# Patient Record
Sex: Male | Born: 1960 | Race: Black or African American | Hispanic: No | Marital: Single | Smoking: Current every day smoker
Health system: Southern US, Community
[De-identification: ages and names within clinical notes are randomized; demographics above are authoritative.]

## PROBLEM LIST (undated history)

## (undated) DIAGNOSIS — J189 Pneumonia, unspecified organism: Secondary | ICD-10-CM

## (undated) DIAGNOSIS — F419 Anxiety disorder, unspecified: Secondary | ICD-10-CM

## (undated) DIAGNOSIS — F209 Schizophrenia, unspecified: Secondary | ICD-10-CM

## (undated) DIAGNOSIS — F32A Depression, unspecified: Secondary | ICD-10-CM

## (undated) DIAGNOSIS — F329 Major depressive disorder, single episode, unspecified: Secondary | ICD-10-CM

## (undated) HISTORY — PX: ABDOMINAL SURGERY: SHX537

---

## 2012-03-06 ENCOUNTER — Emergency Department (HOSPITAL_COMMUNITY): Payer: 59

## 2012-03-06 ENCOUNTER — Encounter (HOSPITAL_COMMUNITY): Payer: Self-pay

## 2012-03-06 ENCOUNTER — Emergency Department (HOSPITAL_COMMUNITY)
Admission: EM | Admit: 2012-03-06 | Discharge: 2012-03-09 | Disposition: A | Discharge status: Other Acute Inpatient Hospital | Payer: 59 | Source: Home / Self Care | Attending: Emergency Medicine | Admitting: Emergency Medicine

## 2012-03-06 DIAGNOSIS — F1010 Alcohol abuse, uncomplicated: Secondary | ICD-10-CM | POA: Insufficient documentation

## 2012-03-06 DIAGNOSIS — F1910 Other psychoactive substance abuse, uncomplicated: Secondary | ICD-10-CM | POA: Insufficient documentation

## 2012-03-06 DIAGNOSIS — F29 Unspecified psychosis not due to a substance or known physiological condition: Secondary | ICD-10-CM

## 2012-03-06 DIAGNOSIS — R45851 Suicidal ideations: Secondary | ICD-10-CM | POA: Insufficient documentation

## 2012-03-06 DIAGNOSIS — Z9114 Patient's other noncompliance with medication regimen: Secondary | ICD-10-CM

## 2012-03-06 DIAGNOSIS — Z9119 Patient's noncompliance with other medical treatment and regimen: Secondary | ICD-10-CM | POA: Insufficient documentation

## 2012-03-06 DIAGNOSIS — R05 Cough: Secondary | ICD-10-CM | POA: Insufficient documentation

## 2012-03-06 DIAGNOSIS — F17200 Nicotine dependence, unspecified, uncomplicated: Secondary | ICD-10-CM | POA: Insufficient documentation

## 2012-03-06 DIAGNOSIS — R209 Unspecified disturbances of skin sensation: Secondary | ICD-10-CM | POA: Insufficient documentation

## 2012-03-06 DIAGNOSIS — R059 Cough, unspecified: Secondary | ICD-10-CM | POA: Insufficient documentation

## 2012-03-06 DIAGNOSIS — Z91199 Patient's noncompliance with other medical treatment and regimen due to unspecified reason: Secondary | ICD-10-CM | POA: Insufficient documentation

## 2012-03-06 DIAGNOSIS — R0602 Shortness of breath: Secondary | ICD-10-CM | POA: Insufficient documentation

## 2012-03-06 DIAGNOSIS — Z8659 Personal history of other mental and behavioral disorders: Secondary | ICD-10-CM | POA: Insufficient documentation

## 2012-03-06 HISTORY — DX: Anxiety disorder, unspecified: F41.9

## 2012-03-06 HISTORY — DX: Schizophrenia, unspecified: F20.9

## 2012-03-06 LAB — CBC WITH DIFFERENTIAL/PLATELET
Eosinophils Absolute: 0 10*3/uL (ref 0.0–0.7)
Lymphocytes Relative: 12 % (ref 12–46)
Lymphs Abs: 1 10*3/uL (ref 0.7–4.0)
Neutrophils Relative %: 73 % (ref 43–77)
Platelets: 198 10*3/uL (ref 150–400)
RBC: 5 MIL/uL (ref 4.22–5.81)
WBC: 8 10*3/uL (ref 4.0–10.5)

## 2012-03-06 LAB — RAPID URINE DRUG SCREEN, HOSP PERFORMED
Amphetamines: NOT DETECTED
Barbiturates: NOT DETECTED
Benzodiazepines: NOT DETECTED
Tetrahydrocannabinol: NOT DETECTED

## 2012-03-06 LAB — COMPREHENSIVE METABOLIC PANEL
ALT: 33 U/L (ref 0–53)
Alkaline Phosphatase: 80 U/L (ref 39–117)
GFR calc Af Amer: >90 mL/min (ref >90)
Glucose, Bld: 85 mg/dL (ref 70–99)
Potassium: 3.5 mEq/L (ref 3.5–5.1)
Sodium: 136 mEq/L (ref 135–145)
Total Protein: 7.6 g/dL (ref 6.0–8.3)

## 2012-03-06 LAB — POCT I-STAT TROPONIN I: Troponin i, poc: 0.01 ng/mL (ref 0.00–0.08)

## 2012-03-06 MED ORDER — ALBUTEROL SULFATE (5 MG/ML) 0.5% IN NEBU
5.0000 mg | INHALATION_SOLUTION | RESPIRATORY_TRACT | Status: DC | PRN
  Administered 2012-03-06: 5 mg via RESPIRATORY_TRACT
  Filled 2012-03-06: qty 1

## 2012-03-06 MED ORDER — IPRATROPIUM BROMIDE 0.02 % IN SOLN
0.5000 mg | Freq: Once | RESPIRATORY_TRACT | Status: AC
  Administered 2012-03-06: 0.5 mg via RESPIRATORY_TRACT
  Filled 2012-03-06: qty 2.5

## 2012-03-06 MED ORDER — ALBUTEROL SULFATE (5 MG/ML) 0.5% IN NEBU
5.0000 mg | INHALATION_SOLUTION | Freq: Once | RESPIRATORY_TRACT | Status: AC
  Administered 2012-03-06: 5 mg via RESPIRATORY_TRACT
  Filled 2012-03-06: qty 1
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
## 2012-03-06 NOTE — ED Notes (Signed)
ACT team at bedside.  

## 2012-03-06 NOTE — ED Notes (Signed)
Patient transported to X-ray 

## 2012-03-06 NOTE — ED Notes (Signed)
PATIENT MADE AWARE THAT HE WILL BE HAVING A TELEPSYCH. PT AGREEABLE. PATIENT ALSO WANTS TO KNOW WHY HE HASNT GOT ANY MORE MEDICATIONS ORDERED. THAT HE THINKS HE NEEDS AN INHALER. THAT HE CANNOT UNDERSTAND WHY MORE ISNT BEING DONE FOR HIM BECAUSE HE IS ACHEY. HE STATES HE HAS A HISTORY OF ASTHMA AND IS A SMOKER

## 2012-03-06 NOTE — ED Notes (Signed)
Pt. Finished with telepsych. Updated on plan of care.

## 2012-03-06 NOTE — ED Notes (Signed)
Pt. Informed to need to urinate. States can't at this time. Gave patient a bottle of water. Informed pt that if he can't urinate we will need to do a urinary catheter.

## 2012-03-06 NOTE — ED Notes (Signed)
Pt. C/o difficulty breathing. States "Ever since I got this cold my asthma is acting up and I keep coughing up stuff". Dr. Radford Pax notified.

## 2012-03-06 NOTE — ED Notes (Signed)
Given urinal. Pt states he will try to urinate.

## 2012-03-06 NOTE — ED Notes (Addendum)
Pt states he started having mid cp about a week ago and also c/o productive cough. Now states he has been coughing up blood instead of mucus greenish yellow. Also reports SOB and numbness in left arm. States he was sent here because he has been out of his psych meds for a while. Has been drinking more eating less and doing cocaine over the last 5 days. His plan is just to drink.

## 2012-03-07 MED ORDER — OLANZAPINE 5 MG PO TABS
5.0000 mg | ORAL_TABLET | Freq: Two times a day (BID) | ORAL | Status: DC
  Administered 2012-03-07 – 2012-03-09 (×5): 5 mg via ORAL
  Filled 2012-03-07 (×8): qty 1

## 2012-03-07 MED ORDER — THIAMINE HCL 100 MG/ML IJ SOLN: 100.0000 mg | Freq: Every day | INTRAMUSCULAR | Status: DC

## 2012-03-07 MED ORDER — LORAZEPAM 1 MG PO TABS
1.0000 mg | ORAL_TABLET | Freq: Four times a day (QID) | ORAL | Status: DC | PRN
  Administered 2012-03-07 – 2012-03-09 (×4): 1 mg via ORAL
  Filled 2012-03-07 (×4): qty 1

## 2012-03-07 MED ORDER — FOLIC ACID 1 MG PO TABS
1.0000 mg | ORAL_TABLET | Freq: Every day | ORAL | Status: DC
  Administered 2012-03-07 – 2012-03-09 (×3): 1 mg via ORAL
  Filled 2012-03-07 (×3): qty 1

## 2012-03-07 MED ORDER — LORAZEPAM 2 MG/ML IJ SOLN: 1.0000 mg | Freq: Four times a day (QID) | INTRAMUSCULAR | Status: DC | PRN

## 2012-03-07 MED ORDER — VITAMIN B-1 100 MG PO TABS
100.0000 mg | ORAL_TABLET | Freq: Every day | ORAL | Status: DC
  Administered 2012-03-07 – 2012-03-09 (×3): 100 mg via ORAL
  Filled 2012-03-07 (×3): qty 1

## 2012-03-07 MED ORDER — ADULT MULTIVITAMIN W/MINERALS CH
1.0000 | ORAL_TABLET | Freq: Every day | ORAL | Status: DC
  Administered 2012-03-07 – 2012-03-09 (×3): 1 via ORAL
  Filled 2012-03-07 (×4): qty 1
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
## 2012-03-07 NOTE — ED Notes (Signed)
Signed up as 2ndary RN to Marylouise Stacks, RN

## 2012-03-07 NOTE — ED Notes (Signed)
Report from [place], Charity fundraiser,

## 2012-03-07 NOTE — BHH Counselor (Signed)
Patient has been accepted at [facility] pending bed availability.

## 2012-03-07 NOTE — ED Notes (Signed)
Pt. Up to shower, changed scrubs, bed made.

## 2012-03-07 NOTE — Progress Notes (Signed)
12:28 AM Pt had telepsychiatry consult by Dr. Henderson Cloud.  He advised that pt needed psychiatric hospitalization for psychosis and suicidal ideation.  He recommended Zyprexa 5 mg bid, and the CIWA protocol with Ativan for treatment of possible alcohol withdrawal.

## 2012-03-07 NOTE — ED Notes (Signed)
Pt. Requested ginger ale and graham crackers, given.
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
## 2012-03-08 NOTE — ED Notes (Signed)
Gave Sitter a lunch break 

## 2012-03-08 NOTE — ED Notes (Signed)
Sitter relieved to go to the bathroom 

## 2012-03-08 NOTE — ED Notes (Signed)
Patient given bag lunch and ginger ale ?

## 2012-03-08 NOTE — BHH Counselor (Signed)
Per Zachary Hull, there are still no beds available on the on the adult unit. Zachary Hull, LCAS

## 2012-03-08 NOTE — ED Provider Notes (Signed)
0830.  Pt is awake, in bed, watching TV.  NAD.  Note stable VS.  No acute overnight events reported.  He has been accepted at [facility] and is awaiting a bed.  Will continue to follow closely.  Tobin Chad, MD 03/08/12 [phone]

## 2012-03-09 ENCOUNTER — Inpatient Hospital Stay (HOSPITAL_COMMUNITY)
Admission: AD | Admit: 2012-03-09 | Discharge: 2012-03-12 | DRG: 885 | Disposition: A | Discharge status: Home / Self Care | Payer: 59 | Source: Ambulatory Visit | Attending: Psychiatry | Admitting: Psychiatry

## 2012-03-09 ENCOUNTER — Encounter (HOSPITAL_COMMUNITY): Payer: Self-pay | Admitting: *Deleted

## 2012-03-09 DIAGNOSIS — Z79899 Other long term (current) drug therapy: Secondary | ICD-10-CM

## 2012-03-09 DIAGNOSIS — F29 Unspecified psychosis not due to a substance or known physiological condition: Secondary | ICD-10-CM

## 2012-03-09 DIAGNOSIS — F1410 Cocaine abuse, uncomplicated: Secondary | ICD-10-CM | POA: Diagnosis present

## 2012-03-09 DIAGNOSIS — F3130 Bipolar disorder, current episode depressed, mild or moderate severity, unspecified: Principal | ICD-10-CM | POA: Diagnosis present

## 2012-03-09 DIAGNOSIS — F1010 Alcohol abuse, uncomplicated: Secondary | ICD-10-CM | POA: Diagnosis present

## 2012-03-09 HISTORY — DX: Depression, unspecified: F32.A

## 2012-03-09 HISTORY — DX: Major depressive disorder, single episode, unspecified: F32.9

## 2012-03-09 MED ORDER — TRAZODONE HCL 50 MG PO TABS
50.0000 mg | ORAL_TABLET | Freq: Every evening | ORAL | Status: DC | PRN
  Administered 2012-03-09: 50 mg via ORAL
  Filled 2012-03-09 (×5): qty 1

## 2012-03-09 MED ORDER — ACETAMINOPHEN 325 MG PO TABS: 650.0000 mg | ORAL_TABLET | Freq: Four times a day (QID) | ORAL | Status: DC | PRN

## 2012-03-09 MED ORDER — NICOTINE 21 MG/24HR TD PT24
21.0000 mg | MEDICATED_PATCH | Freq: Every day | TRANSDERMAL | Status: DC
  Administered 2012-03-10 – 2012-03-12 (×3): 21 mg via TRANSDERMAL
  Filled 2012-03-09 (×6): qty 1

## 2012-03-09 MED ORDER — HYDROXYZINE HCL 25 MG PO TABS
25.0000 mg | ORAL_TABLET | Freq: Four times a day (QID) | ORAL | Status: DC | PRN
  Administered 2012-03-09: 25 mg via ORAL
  Filled 2012-03-09: qty 1

## 2012-03-09 MED ORDER — MAGNESIUM HYDROXIDE 400 MG/5ML PO SUSP: 30.0000 mL | Freq: Every day | ORAL | Status: DC | PRN

## 2012-03-09 MED ORDER — ALUM & MAG HYDROXIDE-SIMETH 200-200-20 MG/5ML PO SUSP: 30.0000 mL | ORAL | Status: DC | PRN

## 2012-03-09 NOTE — ED Notes (Signed)
Report called to Nurse at [facility]

## 2012-03-09 NOTE — Tx Team (Signed)
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  The patient and family have been given the opportunity to ask questions and make suggestions.  [place] Mon 03/09/2012, 2:42 PM

## 2012-03-09 NOTE — ED Notes (Signed)
Clean scrubs given-

## 2012-03-09 NOTE — Progress Notes (Signed)
Adult Psychoeducational Group Note  Date:  03/09/2012 Time:  9:34 PM  Group Topic/Focus:  Wrap-Up Group:   The focus of this group is to help patients review their daily goal of treatment and discuss progress on daily workbooks.  Participation Level:  Active  Participation Quality:  Appropriate  Affect:  Appropriate  Cognitive:  Appropriate  Insight: Appropriate  Engagement in Group:  Engaged  Modes of Intervention:  Support  Additional Comments:  Patient attended and participated in group tonight. He reports that he was at [facility] today and recently came over to [facility]. He slept most of the day while he was at [facility]. This writer explained to him what his schedule would look like tomorrow and instructed him where he could find the schedule.  Lita Mains [facility] 03/09/2012, 9:34 PM

## 2012-03-09 NOTE — Clinical Social Work Note (Signed)
  Type of Therapy: Process Group Therapy  Participation Level:  Did Not Attend    Summary of Progress/Problems: Today's group addressed the issue of overcoming obstacles.  Patients were asked to identify their biggest obstacle post d/c that stands in the way of their on-going success, and then problem solve as to how to manage this.       Zachary Hull 03/09/2012   2:43 PM

## 2012-03-09 NOTE — Progress Notes (Signed)
Patient ID: Zachary Hull, male   DOB: 1960/05/02, 52 y.o.   MRN: [id number] Patient admitted from [facility] for severe alcohol dependency, depression and SI.  Patient was brought into [facility] Friday night due to a 14-day alcohol binge where he was drinking "a fifth and a 1/2 gallon daily."  He reported some cocaine use during that time also.  Patient is depressed over his divorce last month; he was married in November and the marriage was annulled in January.  Patient states that "I've lost everything."  Patient has been staying with a friend; is homeless and unemployed.  He does receive SSI monthly.  He has multiple psyche admissions in ; none of which have been here.  He reports that he feel suicidal with no specific plan.  He does, however, contract for safety.  He also reported hearing voices when he was first brought in, however, denies those now.  He also denies any HI at this time.  Patient reports that he has no family support in the area and will not be returning to his living situation.  He hopes to go to a halfway house or treatment center upon discharge.  His ETOH level was 66 upon admission to [facility] and he was positive for cocaine.

## 2012-03-09 NOTE — ED Provider Notes (Addendum)
Zachary Hull is a 52 y.o. male here with suicidal thoughts, aggressive behavior. Calm this AM, says that he has some shakes during the day. Given hx of alcohol abuse, I placed him on CIWA protocol. Otherwise, medically stable. Pending placement at [facility].    Richardean Canal, MD 03/09/12 0758  10:32 AM Patient accepted at [facility] under Dr. Jannifer Franklin. Stable for transfer.   Richardean Canal, MD 03/09/12 1032
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
## 2012-03-10 ENCOUNTER — Encounter (HOSPITAL_COMMUNITY): Payer: Self-pay | Admitting: Psychiatry

## 2012-03-10 DIAGNOSIS — F3130 Bipolar disorder, current episode depressed, mild or moderate severity, unspecified: Principal | ICD-10-CM

## 2012-03-10 DIAGNOSIS — F1410 Cocaine abuse, uncomplicated: Secondary | ICD-10-CM

## 2012-03-10 DIAGNOSIS — F1010 Alcohol abuse, uncomplicated: Secondary | ICD-10-CM

## 2012-03-10 DIAGNOSIS — F29 Unspecified psychosis not due to a substance or known physiological condition: Secondary | ICD-10-CM

## 2012-03-10 MED ORDER — HALOPERIDOL 5 MG PO TABS
5.0000 mg | ORAL_TABLET | Freq: Two times a day (BID) | ORAL | Status: DC
  Administered 2012-03-10 – 2012-03-12 (×4): 5 mg via ORAL
  Filled 2012-03-10 (×8): qty 1

## 2012-03-10 MED ORDER — TRAZODONE HCL 50 MG PO TABS
150.0000 mg | ORAL_TABLET | Freq: Every day | ORAL | Status: DC
  Administered 2012-03-11: 150 mg via ORAL
  Filled 2012-03-10 (×2): qty 3

## 2012-03-10 MED ORDER — BENZTROPINE MESYLATE 1 MG PO TABS
1.0000 mg | ORAL_TABLET | Freq: Two times a day (BID) | ORAL | Status: DC
  Administered 2012-03-10 – 2012-03-12 (×4): 1 mg via ORAL
  Filled 2012-03-10 (×8): qty 1

## 2012-03-10 MED ORDER — FLUOXETINE HCL 10 MG PO CAPS
10.0000 mg | ORAL_CAPSULE | Freq: Every day | ORAL | Status: DC
  Administered 2012-03-10 – 2012-03-12 (×3): 10 mg via ORAL
  Filled 2012-03-10 (×5): qty 1

## 2012-03-10 NOTE — Clinical Social Work Note (Signed)
[facility] LCSW Group Therapy  03/10/2012 , 2:16 PM   Type of Therapy:  Group Therapy  Participation Level:  Did not attend   Summary of Progress/Problems: Today's group focused on the term Diagnosis.  Participants were asked to define the term, and then pronounce whether it is a negative, positive or neutral term.  Daryel Gerald B 03/10/2012 , 2:16 PM

## 2012-03-10 NOTE — Progress Notes (Addendum)
D: Patient at the [facility] window.  Patient states he has been irritated today.  Patient states he has not been sleeping well.  Patient rates depression and anxiety both 7/10 tonight.  Patient states he has been having passive SI denies HI and states he is having auditory hallucinations hearing his mothers voice.  Patient verbally contracts for safety. A: Staff to monitor Q 15 mins for safety.  Encouragement and support offered.  Scheduled medications administered per orders.  R: Patient remains safe on the unit.  Patient did not attend group tonight.  Patient taking administered medications.

## 2012-03-10 NOTE — [facility] Suicide Risk Assessment (Signed)
Suicide Risk Assessment  Admission Assessment     Nursing information obtained from:    Demographic factors:    Current Mental Status:    Loss Factors:    Historical Factors:    Risk Reduction Factors:     CLINICAL FACTORS:   Severe Anxiety and/or Agitation Bipolar Disorder:   Depressive phase Depression:   Comorbid alcohol abuse/dependence Delusional Hopelessness Impulsivity Insomnia Alcohol/Substance Abuse/Dependencies Currently Psychotic  COGNITIVE FEATURES THAT CONTRIBUTE TO RISK:  Closed-mindedness Polarized thinking    SUICIDE RISK:   Minimal: No identifiable suicidal ideation.  Patients presenting with no risk factors but with morbid ruminations; may be classified as minimal risk based on the severity of the depressive symptoms  PLAN OF CARE:1. Admit for crisis management and stabilization. 2. Medication management to reduce current symptoms to base line and improve the  patient's overall level of functioning 3. Treat health problems as indicated. 4. Develop treatment plan to decrease risk of relapse upon discharge and the need for readmission. 5. Psycho-social education regarding relapse prevention and self care. 6. Health care follow up as needed for medical problems. 7. Restart home medications where appropriate.   I certify that inpatient services furnished can reasonably be expected to improve the patient's condition.  Khamarion Bjelland,MD 03/10/2012, 10:27 AM

## 2012-03-10 NOTE — Progress Notes (Signed)
Patient ID: Zachary Hull, male   DOB: [birth year], 52 y.o.   MRN: [id number] Patient restless and anxious today.  He was concerned about his medications today.  He was started on prozac and haldol today.  He is also concerned that he will be unable to sleep tonight.  He met with MD and expressed his concerns over his medications and was receptive to the information he received.  Patient has been in multiple psyche facilities in the [place] area and has not been consistant with medication regimen.  He has been on a 14 day ETOH binge.  He denies any SI/HI/AVH today.    Continue to monitor medication management and MD orders.  Safety checks continues every 15 minutes per protocol.  Patient has been attending groups and participating.
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
## 2012-03-10 NOTE — Progress Notes (Signed)
[facility] LCSW Aftercare Discharge Planning Group Note  03/10/2012 11:01 AM  Participation Quality:  Did not attend    Zachary Hull 03/10/2012, 11:01 AM

## 2012-03-10 NOTE — Treatment Plan (Signed)
Interdisciplinary Treatment Plan Update (Adult)  Date: 03/10/2012  Time Reviewed: 7:59 AM   Progress in Treatment: Attending groups: Intermittently Participating in groups: Yes,  When there Taking medication as prescribed: Yes Tolerating medication: Yes   Family/Significant other contact made:  No Patient understands diagnosis:  Yes As evidenced by requesting help with mood stabilization and hallucinations Discussing patient identified problems/goals with staff:  Yes  See below Medical problems stabilized or resolved:  Yes Denies suicidal/homicidal ideation: Yes Issues/concerns per patient self-inventory:  Not filled out Other:  New problem(s) identified: N/A  Reason for Continuation of Hospitalization: Depression Hallucinations Medication stabilization Suicidal ideation  Interventions implemented related to continuation of hospitalization: Haldol, Trazodone, Prozac trial  Encourage group attendance and participation  Additional comments:  Estimated length of stay:  Discharge Plan:  New goal(s): N/A  Review of initial/current patient goals per problem list:   1.  Goal(s): Eliminate SI  Met:  No  Target date:2/21  As evidenced ZO:XWRU report  2.  Goal (s): Stabilize mood with the use of medications/therapeutic milieu  Met:  No  Target date:2/21  As evidenced EA:[id number] presents as grim with flat affect.  He readily admits to feeling depressed to the extent that he has passive SI  3.  Goal(s): Eliminate psychosis with the use of medication/therapeutic milieu  Met:  No  Target date:2/21  As evidenced XB:JYNWGN states that he is currently hearing voices, feels paranoid and [resents with delusional thought  4.  Goal(s): Identify comprehensive mental wellness, sobriety plan  Met:  No  Target date:2/21  As evidenced by: Present plan that he and CSW identify  Attendees: Patient:  Zachary Hull 03/10/2012 7:59AM  Family:     Physician:  Thedore Mins  03/10/2012 7:59 AM   Nursing:  Joslyn Devon  03/10/2012 7:59 AM   Clinical Social Worker:  Richelle Ito 03/10/2012 7:59 AM   Extender:  Verne Spurr PA 03/10/2012 7:59 AM   Other:     Other:     Other:     Other:      Scribe for Treatment Team:   Ida Rogue, 03/10/2012 7:59 AM

## 2012-03-11 DIAGNOSIS — F3130 Bipolar disorder, current episode depressed, mild or moderate severity, unspecified: Principal | ICD-10-CM

## 2012-03-11 MED ORDER — TRAZODONE HCL 100 MG PO TABS
200.0000 mg | ORAL_TABLET | Freq: Every day | ORAL | Status: DC
  Administered 2012-03-11: 200 mg via ORAL
  Filled 2012-03-11 (×3): qty 2

## 2012-03-11 NOTE — Progress Notes (Signed)
[facility] MD Progress Note  03/11/2012 2:22 PM Wendy Mikles  MRN:  [id number] Subjective:  "I'm so-so" met with Maisie Fus and treatment team today. He reports he didn't sleep well although it is documented that he slept 6.25 hours. He states he wants to go to [facility] or 14 day program and then to an [facility]. Diagnosis:  Bipolar disorder most recent episode depressed  ADL's:  Intact  Sleep: Poor  Appetite:  Fair  Suicidal Ideation:  denies Homicidal Ideation:  denies AEB (as evidenced by):the patient's verbal report, affect, and behavior.   Psychiatric Specialty Exam: Review of Systems  Constitutional: Negative.  Negative for fever, chills, weight loss, malaise/fatigue and diaphoresis.  HENT: Negative for congestion and sore throat.   Eyes: Negative for blurred vision, double vision and photophobia.  Respiratory: Negative for cough, shortness of breath and wheezing.   Cardiovascular: Negative for chest pain, palpitations and PND.  Gastrointestinal: Negative for heartburn, nausea, vomiting, abdominal pain, diarrhea and constipation.  Musculoskeletal: Negative for myalgias, joint pain and falls.  Neurological: Negative for dizziness, tingling, tremors, sensory change, speech change, focal weakness, seizures, loss of consciousness, weakness and headaches.  Endo/Heme/Allergies: Negative for polydipsia. Does not bruise/bleed easily.  Psychiatric/Behavioral: Negative for depression, suicidal ideas, hallucinations, memory loss and substance abuse. The patient is not nervous/anxious and does not have insomnia.     Blood pressure 114/77, pulse 86, temperature 97.5 F (36.4 C), temperature source Oral, resp. rate 16, height 5\' 5"  (1.651 m), weight 68.947 kg (152 lb).Body mass index is 25.29 kg/(m^2).  General Appearance: Disheveled  Eye Contact::  Good  Speech:  Clear and Coherent  Volume:  Normal  Mood:  Anxious and Depressed  Affect:  Congruent  Thought Process:  Linear  Orientation:  Full  (Time, Place, and Person)  Thought Content:  Negative  Suicidal Thoughts:  No  Homicidal Thoughts:  No  Memory:  Immediate;   Fair  Judgement:  Fair  Insight:  Lacking  Psychomotor Activity:  Negative  Concentration:  Fair  Recall:  Fair  Akathisia:  No  Handed:  Right  AIMS (if indicated):     Assets:  Physical Health  Sleep:  Number of Hours: 6.25   Current Medications: Current Facility-Administered Medications  Medication Dose Route Frequency Provider Last Rate Last Dose  . acetaminophen (TYLENOL) tablet 650 mg  650 mg Oral Q6H PRN Sanjuana Kava, NP      . alum & mag hydroxide-simeth (MAALOX/MYLANTA) 200-200-20 MG/5ML suspension 30 mL  30 mL Oral Q4H PRN Sanjuana Kava, NP      . benztropine (COGENTIN) tablet 1 mg  1 mg Oral BID Mojeed Akintayo   1 mg at 03/11/12 0816  . FLUoxetine (PROZAC) capsule 10 mg  10 mg Oral Daily Mojeed Akintayo   10 mg at 03/11/12 0816  . haloperidol (HALDOL) tablet 5 mg  5 mg Oral BID Mojeed Akintayo   5 mg at 03/11/12 0816  . hydrOXYzine (ATARAX/VISTARIL) tablet 25 mg  25 mg Oral QID PRN Sanjuana Kava, NP   25 mg at 03/09/12 2122  . magnesium hydroxide (MILK OF MAGNESIA) suspension 30 mL  30 mL Oral Daily PRN Sanjuana Kava, NP      . nicotine (NICODERM CQ - dosed in mg/24 hours) patch 21 mg  21 mg Transdermal Q0600 Sanjuana Kava, NP   21 mg at 03/11/12 0630  . traZODone (DESYREL) tablet 200 mg  200 mg Oral QHS Verne Spurr, PA-C  Lab Results: No results found for this or any previous visit (from the past 48 hour(s)).  Physical Findings: AIMS: Facial and Oral Movements Muscles of Facial Expression: None, normal Lips and Perioral Area: None, normal Jaw: None, normal Tongue: None, normal,Extremity Movements Upper (arms, wrists, hands, fingers): None, normal Lower (legs, knees, ankles, toes): None, normal, Trunk Movements Neck, shoulders, hips: None, normal, Overall Severity Severity of abnormal movements (highest score from questions above):  None, normal Incapacitation due to abnormal movements: None, normal Patient's awareness of abnormal movements (rate only patient's report): No Awareness, Dental Status Current problems with teeth and/or dentures?: No Does patient usually wear dentures?: Yes  CIWA:    COWS:     Treatment Plan Summary: Daily contact with patient to assess and evaluate symptoms and progress in treatment Medication management  Plan: 1. Continue crisis management and stabilization. 2. Medication management to reduce current symptoms to base line and improve patient's overall level of functioning 3. Treat health problems as indicated. 4. Develop treatment plan to decrease risk of relapse upon discharge and the need for     readmission. 5. Psycho-social education regarding relapse prevention and self care. 6. Health care follow up as needed for medical problems. 7. Continue home medications where appropriate. 8. Patient will likely discharge out tomorrow if a bed is available for him at [facility].  Medical Decision Making Problem Points:  Established problem, stable/improving (1) Data Points:  Review or order medicine tests (1)  I certify that inpatient services furnished can reasonably be expected to improve the patient's condition.  Rona Ravens.Nachmen Mansel RPAC 03/11/2012, 2:22 PM

## 2012-03-11 NOTE — Progress Notes (Signed)
[facility] LCSW Aftercare Discharge Planning Group Note  03/11/2012 12:38 PM  Participation Quality:  Attentive  Affect:  Flat  Cognitive:  Appropriate  Insight:  Limited  Engagement in Group:  Engaged  Modes of Intervention:  Discussion, Exploration and Socialization  Summary of Progress/Problems: Yamir revealed his agenda today.  Stated that he met his wife on-line, that the marriage was doomed from the start, and he is stuck in [place] until he gets his disability check, which will then open options to him, including the possibility of returning to [place] to stay with an uncle.  He expressed interest in [facility] and [address], but after group was observed carrying information about a house to rent locally.  Revealed he has been in rehab at [facility], [facility] and a place in [place].  Ida Rogue 03/11/2012, 12:38 PM

## 2012-03-11 NOTE — Progress Notes (Signed)
D: Patient denies HI and visual hallucinations and admits to on and off thoughts of SI and auditory hallucinations; patient reports sleep is poor; reports appetite to be improving ; reports energy level is low ; reports ability to pay attention is improving; rates depression as 7/10; rates hopelessness 2/10; rates anxiety as 0/10;   A: Monitored q 15 minutes; patient encouraged to attend groups; patient educated about medications; patient given medications per physician orders; patient encouraged to express feelings and/or concerns  R: Patient reports hearing his mother's voice and also voices telling him that he is not needed ; patient's interaction with staff and peers is appropriate and patient can very bright in affect at times; patient was able to set goal to talk with staff 1:1 when having feelings of SI; patient is taking medications as prescribed and tolerating medications; patient is attending all groups

## 2012-03-11 NOTE — Progress Notes (Signed)
The focus of this group is to help patients review their daily goal of treatment and discuss progress on daily workbooks. Pt attended the evening wrap-up group and participated in the discussion, responding to all prompts from the Writer. Pt reported that he had a good day due in large part to his opening up to staff and peers. "It felt really good to get things off my chest that had been bothering me." Pt stated that he doesn't do this often enough, but realizes the importance of sharing his feelings with others. Pt smiled throughout group and appeared engaged.

## 2012-03-11 NOTE — Clinical Social Work Note (Signed)
[facility] Group Notes:  (Counselor/Nursing/MHT/Case Management/Adjunct)  12/06/2011 12:00 PM  Type of Therapy:  Group Therapy  Participation Level:  Did Not Attend  Smart, Heather N 12/06/2011, 12:00 PM  
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
## 2012-03-11 NOTE — Progress Notes (Signed)
D: Patient in the hallway on approach.  Patient states he had a good day.  Patient states he went outside today and enjoyed the weather.  Patient state she knows what he has to do and that is to stop drinking and to stay on the right track.  Patient states, "I brought this all on myself and I know what I need to do."  Patient states he is still having SI but verbally contracts for safety.  Patient states he is still having auditory hallucinations hearing his mother's voice but states the SI and auditory hallucinations have decreased.   A: Staff to monitor Q 15 mins for safety.  Encouragement and support offered.  Scheduled medications administered per orders.   R: Patient remains safe on the unit.  Patient attended group tonight.  Patient calm cooperative and taking administered medications.

## 2012-03-11 NOTE — Progress Notes (Signed)
Recreation Therapy Notes  03/11/2012         Time: 9:30am      Group Topic/Focus: Leisure Education  Participation Level: Minimal  Participation Quality: Appropriate  Affect: Appropriate  Cognitive: Appropriate   Additional Comments: Patient stayed in group for approximately 5 minutes. Patient returned to group at 10:55. Patient was then asked to leave group by member of nursing staff. Patient returned to group during group wrap up.   Marykay Lex Sadiel Mota, LRT/CTRS   Jearl Klinefelter 03/11/2012 12:51 PM

## 2012-03-12 MED ORDER — FLUOXETINE HCL 10 MG PO CAPS: 10.0000 mg | ORAL_CAPSULE | Freq: Every day | ORAL | Status: DC

## 2012-03-12 MED ORDER — HALOPERIDOL 5 MG PO TABS: 5.0000 mg | ORAL_TABLET | Freq: Two times a day (BID) | ORAL | Status: DC

## 2012-03-12 MED ORDER — TRAZODONE HCL 100 MG PO TABS: 200.0000 mg | ORAL_TABLET | Freq: Every day | ORAL | Status: DC

## 2012-03-12 MED ORDER — BENZTROPINE MESYLATE 1 MG PO TABS: 1.0000 mg | ORAL_TABLET | Freq: Two times a day (BID) | ORAL | Status: DC

## 2012-03-12 NOTE — Progress Notes (Signed)
[facility] INPATIENT:  Family/Significant Other Suicide Prevention Education  Suicide Prevention Education: Pt had 2 outstanding warrants and was picked up by police at d/c. Patient Discharged to Other Healthcare Facility:  Suicide Prevention Education Not Provided:   The patient is discharging to another healthcare facility for continuation of treatment.  The patient's medical information, including suicide ideations and risk factors, are a part of the medical information shared with the receiving healthcare facility.  Daryel Gerald B 03/12/2012, 11:25 AM

## 2012-03-12 NOTE — Discharge Summary (Signed)
Physician Discharge Summary Note  Patient:  Zachary Hull is an 52 y.o., male MRN:  [id number] DOB:  10-Nov-1960 Patient phone:  [phone] (home)  Patient address:   [address] [place] [address],   Date of Admission:  03/09/2012 Date of Discharge: 03/12/2012  Reason for Admission: psychosis with suicidal ideation, cocaine and alcohol abuse  Discharge Diagnoses: Principal Problem:   Bipolar I disorder, most recent episode (or current) depressed, unspecified Active Problems:   Psychosis   Cocaine abuse   Alcohol abuse, unspecified Discharge Diagnoses:  AXIS I: Bipolar I disorder, most recent episode (or current) depressed, unspecified  AXIS II: Antisocial Personality Disorder  AXIS III:  Past Medical History   Diagnosis  Date   AXIS IV: problems related to legal system/crime  AXIS V: 61-70 mild symptoms  Review of Systems  Constitutional: Negative.  Negative for fever, chills, weight loss, malaise/fatigue and diaphoresis.  HENT: Negative for congestion and sore throat.   Eyes: Negative for blurred vision, double vision and photophobia.  Respiratory: Negative for cough, shortness of breath and wheezing.   Cardiovascular: Negative for chest pain, palpitations and PND.  Gastrointestinal: Negative for heartburn, nausea, vomiting, abdominal pain, diarrhea and constipation.  Musculoskeletal: Negative for myalgias, joint pain and falls.  Neurological: Negative for dizziness, tingling, tremors, sensory change, speech change, focal weakness, seizures, loss of consciousness, weakness and headaches.  Endo/Heme/Allergies: Negative for polydipsia. Does not bruise/bleed easily.  Psychiatric/Behavioral: Negative for depression, suicidal ideas, hallucinations, memory loss and substance abuse. The patient is not nervous/anxious and does not have insomnia.    Level of Care:  OP  Hospital Course:  Shah presented to the ED reporting suicidal ideation and auditory and visual hallucinations  after abusing cocaine and alcohol and being non compliant with his medications. He was given medical clearance and transferred to [facility] for further stabilization and crisis management.    Upon transfer to [facility] reported that he was suicidal after finding that his new wife he met on the internet had been cheating on him.  He also noted that he had been all over [place] in various psychiatric facilities and was homeless.  Inocente also neglected to report that he was in violation of his current parole status and had an outstanding charge for absconding.  Of note is the patient's long time criminal history available on [facility] website.     He was treated with Haldol 5mg  po BID for his psychosis, and prozac 10mg  for his symptoms of depression.  He was covered with cogentin for EPS, and given Trazodone for insomnia.  He continued to report "poor sleep" although he was documented as sleeping 6.25 hours or more per night and his Trazodone was increased to 200mg  at night.        He was seen by a clinical provider each day to evaluate his response to treatment and his progress. He was asked about further substance abuse treatment upon discharge and wanted to know what we would do after he went into a 14 day program, where would he go, how would he live?      Ashby was offered further rehab treatment for his substance abuse issues, but was declined due to his outstanding charges.  When asked about further care he asked to be discharged so he could return to his home in [place], [place].  He denied suicidal ideation, homicidal ideation, or AVH.  He was in full contact with reality.  Saurabh was asked about his legal history and became angry  when confronted with the facts but did not act out. The police were notified and he was picked up by them upon discharge.   Consults:  None  Significant Diagnostic Studies:  labs: please see all labs associated with this admission via EMR  Discharge Vitals:   Blood pressure 126/84, pulse  84, temperature 97.6 F (36.4 C), temperature source Oral, resp. rate 20, height 5\' 5"  (1.651 m), weight 68.947 kg (152 lb). Body mass index is 25.29 kg/(m^2). Lab Results:   No results found for this or any previous visit (from the past 72 hour(s)).  Physical Findings: AIMS: Facial and Oral Movements Muscles of Facial Expression: None, normal Lips and Perioral Area: None, normal Jaw: None, normal Tongue: None, normal,Extremity Movements Upper (arms, wrists, hands, fingers): None, normal Lower (legs, knees, ankles, toes): None, normal, Trunk Movements Neck, shoulders, hips: None, normal, Overall Severity Severity of abnormal movements (highest score from questions above): None, normal Incapacitation due to abnormal movements: None, normal Patient's awareness of abnormal movements (rate only patient's report): No Awareness, Dental Status Current problems with teeth and/or dentures?: No Does patient usually wear dentures?: Yes  CIWA:    COWS:     Psychiatric Specialty Exam: See Psychiatric Specialty Exam and Suicide Risk Assessment completed by Attending Physician prior to discharge.  Discharge destination:  Other:  police picked up the patient  Is patient on multiple antipsychotic therapies at discharge:  No   Has Patient had three or more failed trials of antipsychotic monotherapy by history:  No  Recommended Plan for Multiple Antipsychotic Therapies: Not applicable   Discharge Orders   Future Orders Complete By Expires     Diet - low sodium heart healthy  As directed     Discharge instructions  As directed     Comments:      Take all your medications as prescribed by your mental healthcare provider. Report any adverse effects and or reactions from your medicines to your outpatient provider promptly. Patient is instructed and cautioned to not engage in alcohol and or illegal drug use while on prescription medicines. In the event of worsening symptoms, patient is instructed  to call the crisis hotline, 911 and or go to the nearest ED for appropriate evaluation and treatment of symptoms. Follow-up with your primary care provider for your other medical issues, concerns and or health care needs.    Increase activity slowly  As directed         Medication List    STOP taking these medications       aspirin-sod bicarb-citric acid 325 MG Tbef  Commonly known as:  ALKA-SELTZER      TAKE these medications     Indication   benztropine 1 MG tablet  Commonly known as:  COGENTIN  Take 1 tablet (1 mg total) by mouth 2 (two) times daily. For EPS.   Indication:  Extrapyramidal Reaction caused by Medications     FLUoxetine 10 MG capsule  Commonly known as:  PROZAC  Take 1 capsule (10 mg total) by mouth daily. For depression.   Indication:  Depression     haloperidol 5 MG tablet  Commonly known as:  HALDOL  Take 1 tablet (5 mg total) by mouth 2 (two) times daily.   Indication:  Psychosis     traZODone 100 MG tablet  Commonly known as:  DESYREL  Take 2 tablets (200 mg total) by mouth at bedtime.   Indication:  Trouble Sleeping  Follow-up Information   Follow up with [facility].      Follow-up recommendations:  As noted above  Comments:   Total Discharge Time:  Greater than 30 minutes.  Signed: Rona Ravens. Avila Albritton RPAC 11:57 AM 03/12/2012

## 2012-03-12 NOTE — Progress Notes (Signed)
Pt discharged per MD orders; pt currently denies SI/HI and auditory/visual hallucinations; pt was given education by RN regarding follow-up appointments and medications and pt denied any questions or concerns about these instructions; pt was then escorted to search room to retrieve his belongings by RN before being discharged to hospital lobby. 

## 2012-03-12 NOTE — Progress Notes (Signed)
[facility] Adult Case Management Discharge Plan :  Will you be returning to the same living situation after discharge: No. At discharge, do you have transportation home?:Yes,  [place] Police will pick him up for outstanding warrants Do you have the ability to pay for your medications:Yes,  MCD  Release of information consent forms completed and in the chart;  Patient's signature needed at discharge.  Patient to Follow up at: Follow-up Information   Follow up with [facility]. (Go to the walk-in clinic M or W from 1-4PM  Enter thru lower parking lot doors)    Contact information:   [address]  [place]  [phone]      Patient denies SI/HI:   Yes,  yes    Safety Planning and Suicide Prevention discussed:  Yes,  yes  Ida Rogue 03/12/2012, 11:27 AM

## 2012-03-12 NOTE — Progress Notes (Signed)
Adult Psychoeducational Group Note  Date:  03/12/2012 [id number] Group Topic/Focus:  Self Esteem Action Plan:   The focus of this group is to help patients create a plan to continue to build self-esteem after discharge.  Participation Level:  Active  Participation Quality:  Appropriate  Affect:  Appropriate  Cognitive:  Oriented  Insight: Appropriate  Engagement in Group:  Engaged  Modes of Intervention:  Education  Additional Comment Pt attended group this morning.  Lilliemae Fruge E 03/12/2012, 11:10 AM

## 2012-03-12 NOTE — [facility] Suicide Risk Assessment (Signed)
Suicide Risk Assessment  Discharge Assessment     Demographic Factors:  Male, Low socioeconomic status and Unemployed  Mental Status Per Nursing Assessment::   On Admission:     Current Mental Status by Physician: patient denies suicidal ideation, intent or plan  Loss Factors: Legal issues and Financial problems/change in socioeconomic status  Historical Factors: Impulsivity  Risk Reduction Factors:   Living with another person, especially a relative  Continued Clinical Symptoms:  Resolving psychosis  Cognitive Features That Contribute To Risk:  Closed-mindedness    Suicide Risk:  Minimal: No identifiable suicidal ideation.  Patients presenting with no risk factors but with morbid ruminations; may be classified as minimal risk based on the severity of the depressive symptoms  Discharge Diagnoses:   AXIS I:  Bipolar I disorder, most recent episode (or current) depressed, unspecified  AXIS II:  Antisocial Personality Disorder AXIS III:   Past Medical History  Diagnosis Date   AXIS IV:  problems related to legal system/crime AXIS V:  61-70 mild symptoms  Plan Of Care/Follow-up recommendations:  Activity:  as tolerated Diet:  healthy Tests:  routine blood test Other:  patient to keep his after care appointment.  Is patient on multiple antipsychotic therapies at discharge:  No   Has Patient had three or more failed trials of antipsychotic monotherapy by history:  No  Recommended Plan for Multiple Antipsychotic Therapies: N/A  Chayah Mckee,MD 03/12/2012, 10:43 AM

## 2012-03-17 NOTE — Progress Notes (Signed)
Patient Discharge Instructions:  After Visit Summary (AVS):   Faxed to:  03/17/12 Discharge Summary Note:   Faxed to:  03/17/12 Psychiatric Admission Assessment Note:   Faxed to:  03/17/12 Suicide Risk Assessment - Discharge Assessment:   Faxed to:  03/17/12 Faxed/Sent to the Next Level Care provider:  03/17/12 Faxed to [facility] @ [phone]  Jerelene Redden, 03/17/2012, 3:32 PM

## 2013-12-10 ENCOUNTER — Ambulatory Visit: Payer: 59 | Admitting: Family Medicine

## 2014-01-17 ENCOUNTER — Emergency Department (HOSPITAL_COMMUNITY)
Admission: EM | Admit: 2014-01-17 | Discharge: 2014-01-17 | Discharge status: Absent without leave | Payer: Medicaid Other | Source: Home / Self Care

## 2014-08-23 ENCOUNTER — Emergency Department (HOSPITAL_COMMUNITY): Payer: Medicaid Other

## 2014-08-23 ENCOUNTER — Emergency Department (HOSPITAL_COMMUNITY)
Admission: EM | Admit: 2014-08-23 | Discharge: 2014-08-23 | Disposition: A | Discharge status: Home / Self Care | Payer: Medicaid Other | Attending: Emergency Medicine | Admitting: Emergency Medicine

## 2014-08-23 ENCOUNTER — Encounter (HOSPITAL_COMMUNITY): Payer: Self-pay | Admitting: Emergency Medicine

## 2014-08-23 DIAGNOSIS — F209 Schizophrenia, unspecified: Secondary | ICD-10-CM | POA: Diagnosis not present

## 2014-08-23 DIAGNOSIS — Y92410 Unspecified street and highway as the place of occurrence of the external cause: Secondary | ICD-10-CM | POA: Insufficient documentation

## 2014-08-23 DIAGNOSIS — F329 Major depressive disorder, single episode, unspecified: Secondary | ICD-10-CM | POA: Diagnosis not present

## 2014-08-23 DIAGNOSIS — Y9389 Activity, other specified: Secondary | ICD-10-CM | POA: Diagnosis not present

## 2014-08-23 DIAGNOSIS — S199XXA Unspecified injury of neck, initial encounter: Secondary | ICD-10-CM | POA: Insufficient documentation

## 2014-08-23 DIAGNOSIS — F419 Anxiety disorder, unspecified: Secondary | ICD-10-CM | POA: Insufficient documentation

## 2014-08-23 DIAGNOSIS — Y998 Other external cause status: Secondary | ICD-10-CM | POA: Insufficient documentation

## 2014-08-23 DIAGNOSIS — Z720 Tobacco use: Secondary | ICD-10-CM | POA: Diagnosis not present

## 2014-08-23 DIAGNOSIS — Z79899 Other long term (current) drug therapy: Secondary | ICD-10-CM | POA: Insufficient documentation

## 2014-08-23 DIAGNOSIS — S8991XA Unspecified injury of right lower leg, initial encounter: Secondary | ICD-10-CM | POA: Diagnosis present

## 2014-08-23 DIAGNOSIS — S8391XA Sprain of unspecified site of right knee, initial encounter: Secondary | ICD-10-CM | POA: Insufficient documentation

## 2014-08-23 DIAGNOSIS — S79911A Unspecified injury of right hip, initial encounter: Secondary | ICD-10-CM | POA: Diagnosis not present

## 2014-08-23 MED ORDER — OXYCODONE-ACETAMINOPHEN 5-325 MG PO TABS
1.0000 | ORAL_TABLET | Freq: Once | ORAL | Status: AC
  Administered 2014-08-23: 1 via ORAL
  Filled 2014-08-23: qty 1

## 2014-08-23 MED ORDER — OXYCODONE-ACETAMINOPHEN 5-325 MG PO TABS
1.0000 | ORAL_TABLET | ORAL | Status: DC | PRN
Start: 2014-08-23 — End: 2014-12-17

## 2014-08-23 NOTE — Discharge Instructions (Signed)
Wear the immobilizer, use crutches as needed.  Knee Sprain A knee sprain is a tear in one of the strong, fibrous tissues that connect the bones (ligaments) in your knee. The severity of the sprain depends on how much of the ligament is torn. The tear can be either partial or complete. CAUSES  Often, sprains are a result of a fall or injury. The force of the impact causes the fibers of your ligament to stretch too much. This excess tension causes the fibers of your ligament to tear. SIGNS AND SYMPTOMS  You may have some loss of motion in your knee. Other symptoms include:  Bruising.  Pain in the knee area.  Tenderness of the knee to the touch.  Swelling. DIAGNOSIS  To diagnose a knee sprain, your health care provider will physically examine your knee. Your health care provider may also suggest an X-ray exam of your knee to make sure no bones are broken. TREATMENT  If your ligament is only partially torn, treatment usually involves keeping the knee in a fixed position (immobilization) or bracing your knee for activities that require movement for several weeks. To do this, your health care provider will apply a bandage, cast, or splint to keep your knee from moving and to support your knee during movement until it heals. For a partially torn ligament, the healing process usually takes 4-6 weeks. If your ligament is completely torn, depending on which ligament it is, you may need surgery to reconnect the ligament to the bone or reconstruct it. After surgery, a cast or splint may be applied and will need to stay on your knee for 4-6 weeks while your ligament heals. HOME CARE INSTRUCTIONS 1. Keep your injured knee elevated to decrease swelling. 2. To ease pain and swelling, apply ice to the injured area: 1. Put ice in a plastic bag. 2. Place a towel between your skin and the bag. 3. Leave the ice on for 20 minutes, 2-3 times a day. 3. Only take medicine for pain as directed by your health care  provider. 4. Do not leave your knee unprotected until pain and stiffness go away (usually 4-6 weeks). 5. If you have a cast or splint, do not allow it to get wet. If you have been instructed not to remove it, cover it with a plastic bag when you shower or bathe. Do not swim. 6. Your health care provider may suggest exercises for you to do during your recovery to prevent or limit permanent weakness and stiffness. SEEK IMMEDIATE MEDICAL CARE IF: 1. Your cast or splint becomes damaged. 2. Your pain becomes worse. 3. You have significant pain, swelling, or numbness below the cast or splint. MAKE SURE YOU: 1. Understand these instructions. 2. Will watch your condition. 3. Will get help right away if you are not doing well or get worse. Document Released: 01/07/2005 Document Revised: 10/28/2012 Document Reviewed: 08/19/2012 [facility] Patient Information 2015 [place], [place]. This information is not intended to replace advice given to you by your health care provider. Make sure you discuss any questions you have with your health care provider.  Crutch Use Crutches are used to take weight off one of your legs or feet when you stand or walk. It is important to use crutches that fit properly. When fitted properly:  Each crutch should be 2-3 finger widths below the armpit.  Your weight should be supported by your hand, and not by resting the armpit on the crutch.  RISKS AND COMPLICATIONS Damage to the  nerves that extend from your armpit to your hand and arm. To prevent this from happening, make sure your crutches fit properly and do not put pressure on your armpit when using them. HOW TO USE YOUR CRUTCHES If you have been instructed to use partial weight bearing, apply (bear) the amount of weight as your health care provider suggests. Do not bear weight in an amount that causes pain to the area of injury. Walking 7. Step with the crutches. 8. Swing the healthy leg slightly ahead of the  crutches. Going Up Steps If there is no handrail: 4. Step up with the healthy leg. 5. Step up with the crutches and injured leg. 6. Continue in this way. If there is a handrail: 4. Hold both crutches in one hand. 5. Place your free hand on the handrail. 6. While putting your weight on your arms, lift your healthy leg to the step. 7. Bring the crutches and the injured leg up to that step. 8. Continue in this way. Going Down Steps Be very careful, as going down stairs with crutches is very challenging. If there is no handrail: 1. Step down with the injured leg and crutches. 2. Step down with the healthy leg. If there is a handrail: 1. Place your hand on the handrail. 2. Hold both crutches with your free hand. 3. Lower your injured leg and crutch to the step below you. Make sure to keep the crutch tips in the center of the step, never on the edge. 4. Lower your healthy leg to that step. 5. Continue in this way. Standing Up 1. Hold the injured leg forward. 2. Grab the armrest with one hand and the top of the crutches with the other hand. 3. Using these supports, pull yourself up to a standing position. Sitting Down 1. Hold the injured leg forward. 2. Grab the armrest with one hand and the top of the crutches with the other hand. 3. Lower yourself to a sitting position. SEEK MEDICAL CARE IF:  You still feel unsteady on your feet.  You develop new pain, for example in your armpits, back, shoulder, wrist, or hip.  You develop any numbness or tingling. SEEK IMMEDIATE MEDICAL CARE IF: You fall. Document Released: 01/05/2000 Document Revised: 01/12/2013 Document Reviewed: 09/14/2012 [facility] Patient Information 2015 [place], [place]. This information is not intended to replace advice given to you by your health care provider. Make sure you discuss any questions you have with your health care provider.  Acetaminophen; Oxycodone tablets What is this medicine? ACETAMINOPHEN; OXYCODONE  (a set a MEE noe fen; ox i KOE done) is a pain reliever. It is used to treat mild to moderate pain. This medicine may be used for other purposes; ask your health care provider or pharmacist if you have questions. COMMON BRAND NAME(S): Endocet, Magnacet, Narvox, Percocet, Perloxx, Primalev, Primlev, Roxicet, Xolox What should I tell my health care provider before I take this medicine? They need to know if you have any of these conditions: -brain tumor -Crohn's disease, inflammatory bowel disease, or ulcerative colitis -drug abuse or addiction -head injury -heart or circulation problems -if you often drink alcohol -kidney disease or problems going to the bathroom -liver disease -lung disease, asthma, or breathing problems -an unusual or allergic reaction to acetaminophen, oxycodone, other opioid analgesics, other medicines, foods, dyes, or preservatives -pregnant or trying to get pregnant -breast-feeding How should I use this medicine? Take this medicine by mouth with a full glass of water. Follow the directions on the  prescription label. Take your medicine at regular intervals. Do not take your medicine more often than directed. Talk to your pediatrician regarding the use of this medicine in children. Special care may be needed. Patients over 55 years old may have a stronger reaction and need a smaller dose. Overdosage: If you think you have taken too much of this medicine contact a poison control center or emergency room at once. NOTE: This medicine is only for you. Do not share this medicine with others. What if I miss a dose? If you miss a dose, take it as soon as you can. If it is almost time for your next dose, take only that dose. Do not take double or extra doses. What may interact with this medicine? -alcohol -antihistamines -barbiturates like amobarbital, butalbital, butabarbital, methohexital, pentobarbital, phenobarbital, thiopental, and secobarbital -benztropine -drugs for  bladder problems like solifenacin, trospium, oxybutynin, tolterodine, hyoscyamine, and methscopolamine -drugs for breathing problems like ipratropium and tiotropium -drugs for certain stomach or intestine problems like propantheline, homatropine methylbromide, glycopyrrolate, atropine, belladonna, and dicyclomine -general anesthetics like etomidate, ketamine, nitrous oxide, propofol, desflurane, enflurane, halothane, isoflurane, and sevoflurane -medicines for depression, anxiety, or psychotic disturbances -medicines for sleep -muscle relaxants -naltrexone -narcotic medicines (opiates) for pain -phenothiazines like perphenazine, thioridazine, chlorpromazine, mesoridazine, fluphenazine, prochlorperazine, promazine, and trifluoperazine -scopolamine -tramadol -trihexyphenidyl This list may not describe all possible interactions. Give your health care provider a list of all the medicines, herbs, non-prescription drugs, or dietary supplements you use. Also tell them if you smoke, drink alcohol, or use illegal drugs. Some items may interact with your medicine. What should I watch for while using this medicine? Tell your doctor or health care professional if your pain does not go away, if it gets worse, or if you have new or a different type of pain. You may develop tolerance to the medicine. Tolerance means that you will need a higher dose of the medication for pain relief. Tolerance is normal and is expected if you take this medicine for a long time. Do not suddenly stop taking your medicine because you may develop a severe reaction. Your body becomes used to the medicine. This does NOT mean you are addicted. Addiction is a behavior related to getting and using a drug for a non-medical reason. If you have pain, you have a medical reason to take pain medicine. Your doctor will tell you how much medicine to take. If your doctor wants you to stop the medicine, the dose will be slowly lowered over time to  avoid any side effects. You may get drowsy or dizzy. Do not drive, use machinery, or do anything that needs mental alertness until you know how this medicine affects you. Do not stand or sit up quickly, especially if you are an older patient. This reduces the risk of dizzy or fainting spells. Alcohol may interfere with the effect of this medicine. Avoid alcoholic drinks. There are different types of narcotic medicines (opiates) for pain. If you take more than one type at the same time, you may have more side effects. Give your health care provider a list of all medicines you use. Your doctor will tell you how much medicine to take. Do not take more medicine than directed. Call emergency for help if you have problems breathing. The medicine will cause constipation. Try to have a bowel movement at least every 2 to 3 days. If you do not have a bowel movement for 3 days, call your doctor or health care professional. Do not  take Tylenol (acetaminophen) or medicines that have acetaminophen with this medicine. Too much acetaminophen can be very dangerous. Many nonprescription medicines contain acetaminophen. Always read the labels carefully to avoid taking more acetaminophen. What side effects may I notice from receiving this medicine? Side effects that you should report to your doctor or health care professional as soon as possible: -allergic reactions like skin rash, itching or hives, swelling of the face, lips, or tongue -breathing difficulties, wheezing -confusion -light headedness or fainting spells -severe stomach pain -unusually weak or tired -yellowing of the skin or the whites of the eyes Side effects that usually do not require medical attention (report to your doctor or health care professional if they continue or are bothersome): -dizziness -drowsiness -nausea -vomiting This list may not describe all possible side effects. Call your doctor for medical advice about side effects. You may report  side effects to FDA at 1-800-FDA-1088. Where should I keep my medicine? Keep out of the reach of children. This medicine can be abused. Keep your medicine in a safe place to protect it from theft. Do not share this medicine with anyone. Selling or giving away this medicine is dangerous and against the law. Store at room temperature between 20 and 25 degrees C (68 and 77 degrees F). Keep container tightly closed. Protect from light. This medicine may cause accidental overdose and death if it is taken by other adults, children, or pets. Flush any unused medicine down the toilet to reduce the chance of harm. Do not use the medicine after the expiration date. NOTE: This sheet is a summary. It may not cover all possible information. If you have questions about this medicine, talk to your doctor, pharmacist, or health care provider.  2015, [employer]. (2012-08-31 13:17:35)
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
## 2014-08-23 NOTE — ED Notes (Signed)
Patient transported to X-ray 

## 2014-08-23 NOTE — ED Notes (Signed)
Patient returned from XR. 

## 2014-08-23 NOTE — ED Notes (Signed)
Called x-ray to have x-rays put on CD per Dr. Preston Fleeting
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
## 2014-11-14 ENCOUNTER — Emergency Department (HOSPITAL_COMMUNITY)
Admission: EM | Admit: 2014-11-14 | Discharge: 2014-11-14 | Disposition: A | Discharge status: Home / Self Care | Payer: Medicaid Other | Attending: Emergency Medicine | Admitting: Emergency Medicine

## 2014-11-14 ENCOUNTER — Emergency Department (HOSPITAL_COMMUNITY): Payer: Medicaid Other

## 2014-11-14 ENCOUNTER — Encounter (HOSPITAL_COMMUNITY): Payer: Self-pay | Admitting: Vascular Surgery

## 2014-11-14 DIAGNOSIS — Z79899 Other long term (current) drug therapy: Secondary | ICD-10-CM | POA: Insufficient documentation

## 2014-11-14 DIAGNOSIS — Z720 Tobacco use: Secondary | ICD-10-CM | POA: Diagnosis not present

## 2014-11-14 DIAGNOSIS — Z87828 Personal history of other (healed) physical injury and trauma: Secondary | ICD-10-CM | POA: Insufficient documentation

## 2014-11-14 DIAGNOSIS — M25521 Pain in right elbow: Secondary | ICD-10-CM | POA: Diagnosis present

## 2014-11-14 DIAGNOSIS — Y9389 Activity, other specified: Secondary | ICD-10-CM | POA: Insufficient documentation

## 2014-11-14 DIAGNOSIS — F209 Schizophrenia, unspecified: Secondary | ICD-10-CM | POA: Diagnosis not present

## 2014-11-14 DIAGNOSIS — M25532 Pain in left wrist: Secondary | ICD-10-CM | POA: Insufficient documentation

## 2014-11-14 DIAGNOSIS — F419 Anxiety disorder, unspecified: Secondary | ICD-10-CM | POA: Insufficient documentation

## 2014-11-14 DIAGNOSIS — F329 Major depressive disorder, single episode, unspecified: Secondary | ICD-10-CM | POA: Insufficient documentation

## 2014-11-14 DIAGNOSIS — M7021 Olecranon bursitis, right elbow: Secondary | ICD-10-CM | POA: Insufficient documentation

## 2014-11-14 MED ORDER — HYDROCODONE-ACETAMINOPHEN 5-325 MG PO TABS: 2.0000 | ORAL_TABLET | ORAL | Status: DC | PRN
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
## 2014-11-14 NOTE — Progress Notes (Signed)
Orthopedic Tech Progress Note Patient Details:  Zachary Hull 12/11/1960 [id number]  Ortho Devices Type of Ortho Device: Ace wrap, Volar splint Ortho Device/Splint Location: LUE Ortho Device/Splint Interventions: Ordered, Application   Jennye MoccasinHughes, Zachary Hull 11/14/2014, 8:41 PM

## 2014-11-14 NOTE — ED Notes (Addendum)
Charted in error.
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
## 2014-11-14 NOTE — Discharge Instructions (Signed)
Take your medications as prescribed as needed for pain relief. Continue taking ibuprofen as prescribed over-the-counter. You may also use an elbow sleeve that he can purchase at any pharmacy for improvement in swelling of your right elbow. Call orthopedics to schedule a follow-up appointment. Return to the emergency department if symptoms worsen or new onset of fever, swelling, redness, numbness, tingling, weakness.

## 2014-12-16 ENCOUNTER — Emergency Department (HOSPITAL_COMMUNITY)
Admission: EM | Admit: 2014-12-16 | Discharge: 2014-12-17 | Disposition: A | Discharge status: Other Acute Inpatient Hospital | Payer: Medicaid Other | Attending: Emergency Medicine | Admitting: Emergency Medicine

## 2014-12-16 ENCOUNTER — Encounter (HOSPITAL_COMMUNITY): Payer: Self-pay | Admitting: Emergency Medicine

## 2014-12-16 DIAGNOSIS — R440 Auditory hallucinations: Secondary | ICD-10-CM | POA: Diagnosis not present

## 2014-12-16 DIAGNOSIS — F1020 Alcohol dependence, uncomplicated: Secondary | ICD-10-CM | POA: Diagnosis not present

## 2014-12-16 DIAGNOSIS — F1410 Cocaine abuse, uncomplicated: Secondary | ICD-10-CM | POA: Insufficient documentation

## 2014-12-16 DIAGNOSIS — F419 Anxiety disorder, unspecified: Secondary | ICD-10-CM | POA: Diagnosis not present

## 2014-12-16 DIAGNOSIS — F17210 Nicotine dependence, cigarettes, uncomplicated: Secondary | ICD-10-CM | POA: Insufficient documentation

## 2014-12-16 DIAGNOSIS — F1210 Cannabis abuse, uncomplicated: Secondary | ICD-10-CM | POA: Diagnosis not present

## 2014-12-16 DIAGNOSIS — F209 Schizophrenia, unspecified: Secondary | ICD-10-CM

## 2014-12-16 DIAGNOSIS — F29 Unspecified psychosis not due to a substance or known physiological condition: Secondary | ICD-10-CM | POA: Diagnosis present

## 2014-12-16 DIAGNOSIS — R45851 Suicidal ideations: Secondary | ICD-10-CM

## 2014-12-16 DIAGNOSIS — F329 Major depressive disorder, single episode, unspecified: Secondary | ICD-10-CM | POA: Insufficient documentation

## 2014-12-16 LAB — COMPREHENSIVE METABOLIC PANEL
ALBUMIN: 4.7 g/dL (ref 3.5–5.0)
ALT: 40 U/L (ref 17–63)
ANION GAP: 10 (ref 5–15)
AST: 43 U/L — AB (ref 15–41)
Alkaline Phosphatase: 80 U/L (ref 38–126)
BILIRUBIN TOTAL: 1.6 mg/dL — AB (ref 0.3–1.2)
BUN: 10 mg/dL (ref 6–20)
CHLORIDE: 102 mmol/L (ref 101–111)
CO2: 24 mmol/L (ref 22–32)
Calcium: 9.9 mg/dL (ref 8.9–10.3)
Creatinine, Ser: 0.89 mg/dL (ref 0.61–1.24)
GFR calc Af Amer: >60 mL/min (ref >60)
Glucose, Bld: 86 mg/dL (ref 65–99)
POTASSIUM: 4.1 mmol/L (ref 3.5–5.1)
Sodium: 136 mmol/L (ref 135–145)
TOTAL PROTEIN: 7.5 g/dL (ref 6.5–8.1)

## 2014-12-16 LAB — ACETAMINOPHEN LEVEL

## 2014-12-16 LAB — RAPID URINE DRUG SCREEN, HOSP PERFORMED
AMPHETAMINES: NOT DETECTED
BENZODIAZEPINES: NOT DETECTED
Barbiturates: NOT DETECTED
COCAINE: POSITIVE — AB
OPIATES: NOT DETECTED
Tetrahydrocannabinol: POSITIVE — AB

## 2014-12-16 LAB — CBC
HCT: 48.6 % (ref 39.0–52.0)
Hemoglobin: 17.1 g/dL — ABNORMAL HIGH (ref 13.0–17.0)
MCH: 33.1 pg (ref 26.0–34.0)
MCHC: 35.2 g/dL (ref 30.0–36.0)
MCV: 94.2 fL (ref 78.0–100.0)
PLATELETS: 258 10*3/uL (ref 150–400)
RBC: 5.16 MIL/uL (ref 4.22–5.81)
RDW: 13.5 % (ref 11.5–15.5)
WBC: 6.1 10*3/uL (ref 4.0–10.5)

## 2014-12-16 LAB — SALICYLATE LEVEL: Salicylate Lvl: <4 mg/dL (ref 2.8–30.0)

## 2014-12-16 LAB — ETHANOL

## 2014-12-16 MED ORDER — LORAZEPAM 1 MG PO TABS
0.0000 mg | ORAL_TABLET | Freq: Four times a day (QID) | ORAL | Status: DC
  Administered 2014-12-16: 1 mg via ORAL

## 2014-12-16 MED ORDER — BENZTROPINE MESYLATE 1 MG PO TABS
1.0000 mg | ORAL_TABLET | Freq: Two times a day (BID) | ORAL | Status: DC
  Administered 2014-12-16 – 2014-12-17 (×3): 1 mg via ORAL
  Filled 2014-12-16 (×3): qty 1

## 2014-12-16 MED ORDER — THIAMINE HCL 100 MG/ML IJ SOLN
Freq: Once | INTRAVENOUS | Status: AC
  Administered 2014-12-16: 08:00:00 via INTRAVENOUS
  Filled 2014-12-16: qty 1000

## 2014-12-16 MED ORDER — HALOPERIDOL 5 MG PO TABS
5.0000 mg | ORAL_TABLET | Freq: Two times a day (BID) | ORAL | Status: DC
  Administered 2014-12-16 – 2014-12-17 (×2): 5 mg via ORAL
  Filled 2014-12-16 (×3): qty 1

## 2014-12-16 MED ORDER — FLUOXETINE HCL 10 MG PO CAPS
10.0000 mg | ORAL_CAPSULE | Freq: Every day | ORAL | Status: DC
  Administered 2014-12-16 – 2014-12-17 (×2): 10 mg via ORAL
  Filled 2014-12-16 (×2): qty 1

## 2014-12-16 MED ORDER — TRAZODONE HCL 100 MG PO TABS
200.0000 mg | ORAL_TABLET | Freq: Every day | ORAL | Status: DC
  Administered 2014-12-16: 200 mg via ORAL
  Filled 2014-12-16: qty 2

## 2014-12-16 MED ORDER — LORAZEPAM 1 MG PO TABS
0.0000 mg | ORAL_TABLET | Freq: Two times a day (BID) | ORAL | Status: DC
  Filled 2014-12-16: qty 1

## 2014-12-16 MED ORDER — LORAZEPAM 2 MG/ML IJ SOLN: 0.0000 mg | Freq: Two times a day (BID) | INTRAMUSCULAR | Status: DC

## 2014-12-16 MED ORDER — VITAMIN B-1 100 MG PO TABS
100.0000 mg | ORAL_TABLET | Freq: Every day | ORAL | Status: DC
  Administered 2014-12-17: 100 mg via ORAL
  Filled 2014-12-16: qty 1

## 2014-12-16 MED ORDER — ADULT MULTIVITAMIN W/MINERALS CH
1.0000 | ORAL_TABLET | Freq: Once | ORAL | Status: AC
  Administered 2014-12-16: 1 via ORAL
  Filled 2014-12-16: qty 1

## 2014-12-16 MED ORDER — LORAZEPAM 1 MG PO TABS: 0.0000 mg | ORAL_TABLET | Freq: Two times a day (BID) | ORAL | Status: DC

## 2014-12-16 MED ORDER — LORAZEPAM 1 MG PO TABS: 0.0000 mg | ORAL_TABLET | Freq: Four times a day (QID) | ORAL | Status: DC

## 2014-12-16 MED ORDER — LORAZEPAM 2 MG/ML IJ SOLN: 0.0000 mg | Freq: Four times a day (QID) | INTRAMUSCULAR | Status: DC

## 2014-12-16 MED ORDER — THIAMINE HCL 100 MG/ML IJ SOLN: 100.0000 mg | Freq: Every day | INTRAMUSCULAR | Status: DC

## 2014-12-16 NOTE — ED Notes (Signed)
Pt. Noted sleeping in room. No complaints or concerns voiced. No distress or abnormal behavior noted. Will continue to monitor with security cameras. Q 15 minute rounds continue. 

## 2014-12-16 NOTE — ED Notes (Signed)
Pt. Noted in room. No complaints or concerns voiced. No distress or abnormal behavior noted. Will continue to monitor with security cameras. Q 15 minute rounds continue. 

## 2014-12-16 NOTE — ED Notes (Signed)
Report received from American Expressndrea RN. Pt. Sleeping, respirations regular and unlabored.  Will continue to monitor for safety via security cameras and Q 15 minute checks.

## 2014-12-16 NOTE — ED Provider Notes (Addendum)
Sign out received from NP GS. Pt is an 54 y.o. male with history of depression, bipolar disorder who presents to the ED for eval of SI. States that he has had a long psych history that is significantly worse the past 2 weeks since his father's death. Endorses SI. States he thinks about shooting himself all the time. Denies plan. States he does not have firearms at home but knows people who do. He does endorse one suicide attempt 3 years ago. Denies HI. Endorses increased AH. States he does see [facility] a couple times a week but it does not help. He states he has not been taking his psych meds for the past few months. States he needs to be committed for help. He states he has been drinking one fifth plus one pint of alcohol a night. Endorses occasional cocaine use, last use yesterday. Denies MJ or other drugs.  He currently denies chest pain, SOB, fever, chills. Endorses nausea but no vomiting.  UDS is positive for cocaine and THC. Otherwise he has a nonfocal exam, unremarkable labs, and unremarkable vital signs. He is medically cleared. TSS has been consulted.  Addendum: Initially tachycardic on triage but during my exam HR down to 80s. VSS.  Derl BarrowSerena Y Ivania Teagarden, PA-C 12/16/14 [id number]  April Palumbo, MD 12/16/14 [phone]   Carlene CoriaSerena Y Dmonte Maher, PA-C 12/16/14 0725  Cy BlamerApril Palumbo, MD 12/16/14 [phone]

## 2014-12-16 NOTE — [facility] Counselor (Signed)
Patient has been accepted to [facility] per Berneice Heinrichina Tate, RN, [facility] 8[facility]. Call nurse report to [phone]. Patient reviewed and signed voluntary consent to treat and ROI to no one. Patient denies any questions or concerns. Patient nurse made aware. [facility] and provided to patients nurse for transport. Marland Kitchen.  Davina PokeJoVea Samie Barclift, LCSW Therapeutic Triage Specialist  Health 12/16/2014 6:24 PM
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
## 2014-12-16 NOTE — BH Assessment (Signed)
Assessment completed. Disposition pending review with extender.

## 2014-12-16 NOTE — ED Notes (Signed)
[facility] with c/o of "hearing voices for about 2 weeks". Pt states the voices are telling him to kill himself. Pt has no plan for killing himself. Pt with hx of schizophrenia and has been out of his medication for 3 months. Pt denies HI Pt is voluntary at this time. Pt is calm and cooperative.
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
## 2014-12-16 NOTE — ED Notes (Signed)
[facility]  Belonging given to [facility]

## 2014-12-16 NOTE — ED Notes (Signed)
Psychiatry at bedside.

## 2014-12-17 ENCOUNTER — Encounter (HOSPITAL_COMMUNITY): Payer: Self-pay

## 2014-12-17 ENCOUNTER — Inpatient Hospital Stay (HOSPITAL_COMMUNITY)
Admission: AD | Admit: 2014-12-17 | Discharge: 2014-12-20 | DRG: 885 | Disposition: A | Discharge status: Home / Self Care | Payer: Medicaid Other | Source: Intra-hospital | Attending: Psychiatry | Admitting: Psychiatry

## 2014-12-17 DIAGNOSIS — F17210 Nicotine dependence, cigarettes, uncomplicated: Secondary | ICD-10-CM | POA: Diagnosis present

## 2014-12-17 DIAGNOSIS — F329 Major depressive disorder, single episode, unspecified: Secondary | ICD-10-CM | POA: Diagnosis not present

## 2014-12-17 DIAGNOSIS — F203 Undifferentiated schizophrenia: Principal | ICD-10-CM | POA: Diagnosis present

## 2014-12-17 DIAGNOSIS — R45851 Suicidal ideations: Secondary | ICD-10-CM | POA: Diagnosis present

## 2014-12-17 DIAGNOSIS — F1220 Cannabis dependence, uncomplicated: Secondary | ICD-10-CM | POA: Diagnosis present

## 2014-12-17 DIAGNOSIS — F1020 Alcohol dependence, uncomplicated: Secondary | ICD-10-CM | POA: Diagnosis present

## 2014-12-17 DIAGNOSIS — Z818 Family history of other mental and behavioral disorders: Secondary | ICD-10-CM

## 2014-12-17 DIAGNOSIS — F1410 Cocaine abuse, uncomplicated: Secondary | ICD-10-CM | POA: Diagnosis present

## 2014-12-17 MED ORDER — TRAZODONE HCL 100 MG PO TABS
200.0000 mg | ORAL_TABLET | Freq: Every day | ORAL | Status: DC
  Filled 2014-12-17 (×6): qty 2

## 2014-12-17 MED ORDER — ALUM & MAG HYDROXIDE-SIMETH 200-200-20 MG/5ML PO SUSP: 30.0000 mL | ORAL | Status: DC | PRN

## 2014-12-17 MED ORDER — MAGNESIUM HYDROXIDE 400 MG/5ML PO SUSP: 30.0000 mL | Freq: Every day | ORAL | Status: DC | PRN

## 2014-12-17 MED ORDER — ACETAMINOPHEN 325 MG PO TABS: 650.0000 mg | ORAL_TABLET | Freq: Four times a day (QID) | ORAL | Status: DC | PRN

## 2014-12-17 MED ORDER — BENZTROPINE MESYLATE 1 MG PO TABS
1.0000 mg | ORAL_TABLET | Freq: Two times a day (BID) | ORAL | Status: DC
  Administered 2014-12-17 – 2014-12-19 (×4): 1 mg via ORAL
  Filled 2014-12-17 (×10): qty 1

## 2014-12-17 MED ORDER — NICOTINE 21 MG/24HR TD PT24
21.0000 mg | MEDICATED_PATCH | Freq: Every day | TRANSDERMAL | Status: DC
  Administered 2014-12-18 – 2014-12-20 (×2): 21 mg via TRANSDERMAL
  Filled 2014-12-17 (×7): qty 1

## 2014-12-17 MED ORDER — LORAZEPAM 1 MG PO TABS
0.0000 mg | ORAL_TABLET | Freq: Two times a day (BID) | ORAL | Status: DC
Start: 2014-12-19 — End: 2014-12-20
  Administered 2014-12-19: 2 mg via ORAL
  Filled 2014-12-17: qty 2

## 2014-12-17 MED ORDER — LORAZEPAM 1 MG PO TABS
0.0000 mg | ORAL_TABLET | Freq: Four times a day (QID) | ORAL | Status: AC
  Administered 2014-12-18: 1 mg via ORAL
  Filled 2014-12-17: qty 1

## 2014-12-17 MED ORDER — HALOPERIDOL 5 MG PO TABS
5.0000 mg | ORAL_TABLET | Freq: Two times a day (BID) | ORAL | Status: DC
  Administered 2014-12-17 – 2014-12-20 (×6): 5 mg via ORAL
  Filled 2014-12-17 (×12): qty 1

## 2014-12-17 MED ORDER — VITAMIN B-1 100 MG PO TABS
100.0000 mg | ORAL_TABLET | Freq: Every day | ORAL | Status: DC
  Administered 2014-12-17 – 2014-12-20 (×4): 100 mg via ORAL
  Filled 2014-12-17 (×8): qty 1

## 2014-12-17 MED ORDER — FLUOXETINE HCL 10 MG PO CAPS
10.0000 mg | ORAL_CAPSULE | Freq: Every day | ORAL | Status: DC
  Administered 2014-12-17 – 2014-12-19 (×3): 10 mg via ORAL
  Filled 2014-12-17 (×7): qty 1

## 2014-12-17 NOTE — ED Notes (Signed)
Pt. Noted sleeping in room. No complaints or concerns voiced. No distress or abnormal behavior noted. Will continue to monitor with security cameras. Q 15 minute rounds continue. 

## 2014-12-17 NOTE — ED Notes (Signed)
On the phone 
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
## 2014-12-17 NOTE — ED Notes (Signed)
Pt ambulatory w/o difficulty to [facility] w/ [place].  Belongings given to driver 

## 2014-12-17 NOTE — ED Notes (Signed)
In the bathroom to shower and change scrubs 

## 2014-12-17 NOTE — Tx Team (Signed)
Initial Interdisciplinary Treatment Plan   PATIENT STRESSORS: Financial difficulties Medication change or noncompliance Substance abuse   PATIENT STRENGTHS: Ability for insight Average or above average intelligence Capable of independent living Communication skills General fund of knowledge Motivation for treatment/growth   PROBLEM LIST: Problem List/Patient Goals Date to be addressed Date deferred Reason deferred Estimated date of resolution  Substance Abuse 12/17/2014     "try to get help so I can do the right thing, this cost me too much." 12/17/2014     "to stop drinking" 12/17/2014     Medication noncompliant 12/17/2014                                    DISCHARGE CRITERIA:  Ability to meet basic life and health needs Improved stabilization in mood, thinking, and/or behavior  PRELIMINARY DISCHARGE PLAN: Return to previous living arrangement  PATIENT/FAMIILY INVOLVEMENT: This treatment plan has been presented to and reviewed with the patient, Zachary [name] 12/17/2014, 2:34 PM

## 2014-12-18 ENCOUNTER — Encounter (HOSPITAL_COMMUNITY): Payer: Self-pay | Admitting: Psychiatry

## 2014-12-18 MED ORDER — ENSURE ENLIVE PO LIQD: 237.0000 mL | ORAL | Status: DC | PRN

## 2014-12-18 NOTE — Progress Notes (Signed)
Adult Psychoeducational Group Note  Date:  12/18/2014 Time:  9:16 PM  Group Topic/Focus:  Wrap-Up Group:   The focus of this group is to help patients review their daily goal of treatment and discuss progress on daily workbooks.  Participation Level:  Active  Participation Quality:  Appropriate and Attentive  Affect:  Appropriate  Cognitive:  Appropriate  Insight: Appropriate and Good  Engagement in Group:  Engaged  Modes of Intervention:  Education  Additional Comments:  Pt overall had a good day and was able to level out his thoughts. Pt goal is to see another day.   Merlinda FrederickKeshia S Nickia Boesen 12/18/2014, 9:16 PM

## 2014-12-18 NOTE — Progress Notes (Signed)
  NUTRITION ASSESSMENT  Pt identified as at risk on the Malnutrition Screen Tool  INTERVENTION: 1. Supplements: Ensure Enlive po PRN, each supplement provides 350 kcal and 20 grams of protein  NUTRITION DIAGNOSIS: Unintentional weight loss related to sub-optimal intake as evidenced by pt report.   Goal: Pt to meet >/= 90% of their estimated nutrition needs.  Monitor:  PO intake  Assessment:  Pt admitted with depression and anxiety. Pt reports poor appetite and 20 lb weight loss over the past month. Weight history documentation does not show this much weight loss. RD to order Ensure supplements PRN, in case pt has poor PO during this admission.   Height: Ht Readings from Last 1 Encounters:  12/17/14 5\' 5"  (1.651 m)    Weight: Wt Readings from Last 1 Encounters:  12/17/14 169 lb (76.658 kg)    Weight Hx: Wt Readings from Last 10 Encounters:  12/17/14 169 lb (76.658 kg)  08/23/14 168 lb (76.204 kg)  03/09/12 152 lb (68.947 kg)    BMI:  Body mass index is 28.12 kg/(m^2). Pt meets criteria for overweight based on current BMI.  Estimated Nutritional Needs: Kcal: 25-30 kcal/kg Protein: > 1 gram protein/kg Fluid: 1 ml/kcal  Diet Order: Diet regular Room service appropriate?: Yes; Fluid consistency:: Thin Pt is also offered choice of unit snacks mid-morning and mid-afternoon.  Pt is eating as desired.   Lab results and medications reviewed.   Tilda FrancoLindsey Darina Hartwell, MS, RD, LDN Pager: [phone] After Hours Pager: [phone]

## 2014-12-18 NOTE — [facility] Suicide Risk Assessment (Signed)
[facility], [facility], [facility] Admission Suicide Risk Assessment   Nursing information obtained from:  Patient Demographic factors:  Living alone, Unemployed Current Mental Status:  NA Loss Factors:  Loss of significant relationship, Financial problems / change in socioeconomic status Historical Factors:  Prior suicide attempts Risk Reduction Factors:  NA Total Time spent with patient: 45 minutes Principal Problem: <principal problem not specified> Diagnosis:   Patient Active Problem List   Diagnosis Date Noted  . 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 Alcohol use disorder, severe, dependence (HCC) [F10.20] 12/16/2014  . Bipolar I disorder, most recent episode (or current) depressed, unspecified [F31.30] 03/10/2012  . Psychosis [F29] 03/10/2012  . Alcohol abuse, unspecified [F10.10] 03/10/2012  . Cocaine abuse [F14.10] 03/10/2012     Continued Clinical Symptoms:  Alcohol Use Disorder Identification Test Final Score (AUDIT): 34 The "Alcohol Use Disorders Identification Test", Guidelines for Use in Primary Care, Second Edition.  World Science writer [facility]). Score between 0-7:  no or low risk or alcohol related problems. Score between 8-15:  moderate risk of alcohol related problems. Score between 16-19:  high risk of alcohol related problems. Score 20 or above:  warrants further diagnostic evaluation for alcohol dependence and treatment.   CLINICAL FACTORS:   Alcohol/Substance Abuse/Dependencies Schizophrenia:   Paranoid or undifferentiated type   Musculoskeletal: Strength & Muscle Tone: within normal limits Gait & Station: normal Patient leans: normal  Psychiatric Specialty Exam: Physical Exam  Review of Systems  Constitutional: Negative.   Eyes: Negative.   Respiratory: Negative.   Cardiovascular: Negative.   Gastrointestinal: Negative.   Genitourinary: Negative.   Musculoskeletal: Positive for joint pain.  Skin: Negative.   Neurological: Positive for  headaches.  Endo/Heme/Allergies: Negative.   Psychiatric/Behavioral: Positive for depression, hallucinations and substance abuse. The patient is nervous/anxious.     Blood pressure 122/74, pulse 67, temperature 98.4 F (36.9 C), temperature source Oral, resp. rate 16, height  (1.651 m), weight 76.658 kg (169 lb), SpO2 100 %.Body mass index is 28.12 kg/(m^2).  General Appearance: Fairly Groomed  Patent attorney::  Fair  Speech:  Clear and Coherent  Volume:  Decreased  Mood:  Anxious and Depressed  Affect:  Restricted  Thought Process:  Coherent and Goal Directed  Orientation:  Full (Time, Place, and Person)  Thought Content:  symptoms events worries concerns  Suicidal Thoughts:  No  Homicidal Thoughts:  No  Memory:  Immediate;   Fair Recent;   Fair Remote;   Fair  Judgement:  Fair  Insight:  Present  Psychomotor Activity:  Normal  Concentration:  Fair  Recall:  [employer] of Knowledge:Fair  Language: Fair  Akathisia:  No  Handed:  Right  AIMS (if indicated):     Assets:  Desire for Improvement Housing  Sleep:  Number of Hours: 6.25  Cognition: WNL  ADL's:  Intact     COGNITIVE FEATURES THAT CONTRIBUTE TO RISK:  Closed-mindedness, Polarized thinking and Thought constriction (tunnel vision)   54 Y/O male who states he had a hard time when his father died few weeks ago. He relapsed on alcohol and cocaine. He states that he was hearing voices that told him to hurt himself. He was not being compliant with his medications. He gets services at [facility]. He is willing to go back there. He states that he has been drinking every day but that he has not experienced any withdrawal. He was on Haldol and cogentin. He is on disability for his "mental condition" he does work few hours on  a [employer] place Past Psych; [facility] on Haldol and Cogentin Family History; Denies family history of any psychiatric illnesess SUICIDE RISK:   Moderate:  Frequent suicidal ideation with limited intensity, and  duration, some specificity in terms of plans, no associated intent, good self-control, limited dysphoria/symptomatology, some risk factors present, and identifiable protective factors, including available and accessible social support.  PLAN OF CARE: Supportive approach/coping skills Alcohol dependence; Ativan detox protocol/work a relapse prevention plan Cocaine abuse; monitor mood instability from coming off the cocaine Hallucinations; resume the Haldol and optimize response EPS; resume the Cogentin Depression; Prozac 10 mg daily Bereavement; help process the feelings associated to the death of his father Use CBT/midfulness/work to improve his reality testing Explore residential treatment options Medical Decision Making:  Review of Psycho-Social Stressors (1), Review or order clinical lab tests (1), Review of Medication Regimen & Side Effects (2) and Review of New Medication or Change in Dosage (2)  I certify that inpatient services furnished can reasonably be expected to improve the patient's condition.   Zachary Hull A 12/18/2014, 1:16 PM

## 2014-12-18 NOTE — Progress Notes (Signed)
[facility] Group Notes:  (Nursing/MHT/Case Management/Adjunct)  Date:  12/18/2014  Time:  12:09 AM  Type of Therapy:  Psychoeducational Skills  Participation Level:  Minimal  Participation Quality:  Inattentive  Affect:  Irritable  Cognitive:  Lacking  Insight:  Lacking  Engagement in Group:  Lacking  Modes of Intervention:  Education  Summary of Progress/Problems: The patient had very little to share in group and he admitted to having a bad day. The patient was unable to come up with a coping skill (theme of the day).   Brantley Wiley S 12/18/2014, 12:09 AM

## 2014-12-18 NOTE — Progress Notes (Addendum)
D:Per patient self inventory form pt reports he slept poor last night. Pt reports a poor appetite, low energy level, good concentration. Pt rates depression 6/10, hopelessness 6/10, anxiety 6/10- all on 0-10 scale, 10 being the worse. Pt denies S/S of withdrawals. Pt denies SI/HI. Denies AVH. Pt denies physical problems. Pt reports his goal for the day is "Self-honesty" Pt reports he will "try to stay focus" to meet his goal. Pt denies SI/HI. Denies AVH  A:Special checks q 15 mins in place for safety. Medication administered per MD order (see eMAR). Encouragement and support provided. CIWA protocol in place.  R:Safety maintained. Compliant with medication regimen. Will continue to monitor.

## 2014-12-18 NOTE — Plan of Care (Signed)
Problem: Alteration in mood & ability to function due to Goal: LTG-Pt verbalizes understanding of importance of med regimen (Patient verbalizes understanding of importance of medication regimen and need to continue outpatient care and support groups)  Outcome: Not Progressing Patient refused HS medications.

## 2014-12-18 NOTE — Progress Notes (Signed)
Pt put in 72 hour notice for discharge 12/18/2014 @ 1750

## 2014-12-18 NOTE — [facility] Counselor (Signed)
Adult Comprehensive Assessment  Patient ID: Zachary Hull, male   DOB: [birth year], 54 y.o.   MRN: [id number]  Information Source: Information source: Patient  Current Stressors:  Employment / Job issues: on disability Family Relationships: no family/friend support Surveyor, quantityinancial / Lack of resources (include bankruptcy): on fixed income Bereavement / Loss: father passed away a few weeks ago by heart attack  Living/Environment/Situation:  Living Arrangements: Alone Living conditions (as described by patient or guardian): [place]  Pt reports it's a decent environment.  How long has patient lived in current situation?: 3 months What is atmosphere in current home: Comfortable  Family History:  Marital status: Divorced Divorced, when?: last year What types of issues is patient dealing with in the relationship?: pt reports he was drinking and his wife was tired of it Additional relationship information: N/A Are you sexually active?: No What is your sexual orientation?: heterosexual Has your sexual activity been affected by drugs, alcohol, medication, or emotional stress?: no Does patient have children?: No  Childhood History:  By whom was/is the patient raised?: Grandparents Additional childhood history information: Pt reports having a great childhood.  Pt states that he was raised by his grandparents because his parents worked.  Description of patient's relationship with caregiver when they were a child: Pt reports getting along great with grandparents growing up.  Patient's description of current relationship with people who raised him/her: Grandparents and parents are deceased.   How were you disciplined when you got in trouble as a child/adolescent?: talked to Does patient have siblings?: No Did patient suffer any verbal/emotional/physical/sexual abuse as a child?: No Did patient suffer from severe childhood neglect?: No Has patient ever been sexually  abused/assaulted/raped as an adolescent or adult?: No Was the patient ever a victim of a crime or a disaster?: No Witnessed domestic violence?: No Has patient been effected by domestic violence as an adult?: No  Education:  Highest grade of school patient has completed: graduated high school Currently a Consulting civil engineerstudent?: No Learning disability?: No  Employment/Work Situation:   Employment situation: On disability Why is patient on disability: mental health How long has patient been on disability: since 2009 Patient's job has been impacted by current illness: No What is the longest time patient has a held a job?: 2 years Where was the patient employed at that time?: [employer] Has patient ever been in the [employer] and Companymilitary?: No Has patient ever served in combat?: No Did You Receive Any Psychiatric Treatment/Services While in Equities traderthe Military?: No Are There Guns or Other Weapons in Your Home?: No Are These Weapons Safely Secured?: Yes  Financial Resources:   Financial resources: [employer]ives SSI, Medicaid Does patient have a Lawyerrepresentative payee or guardian?: No  Alcohol/Substance Abuse:   What has been your use of drugs/alcohol within the last 12 months?: Alcohol - 1/5, 1 pint and 40 oz daily for the last 3 weeks.  Cocaine - $20 worth one time on Friday.  Reports being clean and sober 2 years prior to this use If attempted suicide, did drugs/alcohol play a role in this?: Yes Alcohol/Substance Abuse Treatment Hx: Past Tx, Inpatient, Attends AA/NA, Past detox, Past Tx, Outpatient If yes, describe treatment: Reports being inpatient numerous times for his drug/alcohol use - unable to name the centers Has alcohol/substance abuse ever caused legal problems?: Yes (DUI in 1999)  Social Support System:   Patient's Community Support System: Good Describe Community Support System: pt reports his ACT team is his main support.  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 In what areas does patient struggle / problems for patient: depression, SI  Discharge Plan:   Does patient have access to transportation?: Yes Will patient be returning to same living situation after discharge?: Yes Currently receiving community mental health services: Yes (From Whom) [facility] team) If no, would patient like referral for services when discharged?: Yes (What county?) [facility]) Does patient have financial barriers related to discharge medications?: No  Summary/Recommendations:     Patient is a 54 year old African American Male with a diagnosis of Schizophrenia Disorder.  Patient lives in [place] alone.  Pt reports being depressed and down, with suicidal ideation, after losing his father a few weeks ago.  Pt reports being clean and sober for 2 years but relapsed 3 weeks ago, after the death of his father.  Pt reports working with [facility] team for the last year, and lists them as his main support.  Pt requests them to be contacted for transportation at discharge, as he reports his car is parked at [place] currently.  Patient will benefit from crisis stabilization, medication evaluation, group therapy and psycho education in addition to case management for discharge planning. Discharge Process and Patient Expectations information sheet signed by patient, witnessed by writer and inserted in patient's shadow chart.    Pt is a smoker but declines [facility] Quit[facility] at this time.    Horton, Salome Arnt. 12/18/2014

## 2014-12-18 NOTE — [facility] Group Notes (Signed)
[facility] Group Notes:  (Clinical Social Work)  12/18/2014  [facility] Group Notes:  (Clinical Social Work)  12/18/2014  11:00AM-12:00PM  Summary of Progress/Problems:  The main focus of today's process group was to listen to a variety of genres of music and to identify that different types of music provoke different responses.  The patient then was able to identify personally what was soothing for them, as well as energizing.  Handouts were used to record feelings evoked, as well as how patient can personally use this knowledge in sleep habits, with depression, and with other symptoms.  The patient expressed little during group and kept being pulled out to see other practitioners, as well as sleeping.  Type of Therapy:  Music Therapy   Participation Level:  Minimal  Participation Quality:  Minimal  Affect:  Blunted  Cognitive: N/A  Insight:  Limited  Engagement in Therapy:  Limited  Modes of Intervention:   Activity, Exploration  Ambrose MantleMareida Grossman-Orr, LCSW 12/18/2014
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
## 2014-12-18 NOTE — Progress Notes (Signed)
D: Patient alert and oriented x 4. Patient denies pain/SI/HI and at the time of assessment AVH. Patient reported he was having some audible hallucinations earlier in the day but he was not having any at the time of assessment. Patient became upset when roommate came to room to go to bed. Patient thought he had the room to himself. Patient was able to calm down with no issues. Patient refused HS medications, stating "I don't need anything to help me sleep or ativan."    A: Staff to monitor Q 15 mins for safety. Encouragement and support offered. R: Patient remains safe on the unit. Patient attended group tonight. Patient visible on the unit and interacting with peers.

## 2014-12-18 NOTE — [facility] Suicide Risk Assessment (Signed)
[facility] Adult Inpatient Family/Significant Other Suicide Prevention Education  Suicide Prevention Education:   [facility]ificant Other Suicide Prevention Education: The patient has refused to provide written consent for family/significant other to be provided Family/Significant Other Suicide Prevention Education during admission and/or prior to discharge.  Physician notified.  CSW provided suicide prevention information with patient.    The suicide prevention education provided includes the following:  Suicide risk factors  Suicide prevention and interventions  National Suicide Hotline telephone number  [facility] assessment telephone number  [facility] Assistance 911  [facility] and/or Residential Mobile Crisis Unit telephone number   Reyes IvanChelsea Horton, [place] 12/18/2014 11:31 AM

## 2014-12-19 DIAGNOSIS — R45850 Homicidal ideations: Secondary | ICD-10-CM

## 2014-12-19 DIAGNOSIS — F1020 Alcohol dependence, uncomplicated: Secondary | ICD-10-CM

## 2014-12-19 DIAGNOSIS — F1410 Cocaine abuse, uncomplicated: Secondary | ICD-10-CM

## 2014-12-19 DIAGNOSIS — R45851 Suicidal ideations: Secondary | ICD-10-CM

## 2014-12-19 DIAGNOSIS — F1220 Cannabis dependence, uncomplicated: Secondary | ICD-10-CM | POA: Clinically undetermined

## 2014-12-19 DIAGNOSIS — F203 Undifferentiated schizophrenia: Principal | ICD-10-CM

## 2014-12-19 MED ORDER — BENZTROPINE MESYLATE 0.5 MG PO TABS
0.5000 mg | ORAL_TABLET | Freq: Two times a day (BID) | ORAL | Status: DC
  Administered 2014-12-19 – 2014-12-20 (×2): 0.5 mg via ORAL
  Filled 2014-12-19 (×6): qty 1

## 2014-12-19 MED ORDER — FLUOXETINE HCL 20 MG PO CAPS
20.0000 mg | ORAL_CAPSULE | Freq: Every day | ORAL | Status: DC
  Administered 2014-12-20: 20 mg via ORAL
  Filled 2014-12-19 (×3): qty 1

## 2014-12-19 NOTE — Progress Notes (Signed)
D: Pt denies SI/HI/AVH. Pt is pleasant and cooperative. Pt stated he felt much better, pt said he just wanted to start back on his medications he's been off for 1 month.   A: Pt was offered support and encouragement. Pt was given scheduled medications. Pt was encourage to attend groups. Q 15 minute checks were done for safety.   R:Pt attends groups and interacts well with peers and staff. Pt is taking medication. Pt has no complaints at this time .Pt receptive to treatment and safety maintained on unit.

## 2014-12-19 NOTE — Progress Notes (Signed)
D) Pt affect and mood have been sullen, irritable. Pt can be hostile and guarded. Zachary Hull appears entitled regarding f/u and placement. Pt has been positive for unit activities with minimal prompting. Pt ACT team worker visited with pt after lunch. Pt denies avh or s.i. A) Level 3 obs for safety, support and encouragement provided. Med ed reinforced. Pt encouraged to speak with CM regarding d/c planning. R) Irritable.
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
## 2014-12-19 NOTE — Tx Team (Signed)
Interdisciplinary Treatment Plan Update (Adult)  Date:  12/19/2014   Time Reviewed:  11:23 AM   Progress in Treatment: Attending groups: Yes. Participating in groups:  Yes. Taking medication as prescribed:  Yes. Tolerating medication:  Yes. Family/Significant other contact made:  No Patient understands diagnosis:  Yes  As evidenced by seeking help with SI, psychosis Discussing patient identified problems/goals with staff:  Yes, see initial care plan. Medical problems stabilized or resolved:  Yes. Denies suicidal/homicidal ideation: Yes. Issues/concerns per patient self-inventory:  No. Other:  New problem(s) identified:  Discharge Plan or Barriers: see below  Reason for Continuation of Hospitalization: Hallucinations Medication stabilization  Comments:  Zachary Hull is an 54 y.o. male presenting to [facility] reporting suicidal ideations with a plan to harm himself with a knife. Pt stated "I haven't been taking my meds for about 1 month because it upsets my stomach". Pt also reported that his father passed away approximately 2 weeks ago and since then he has been having mood swings, changes in his thinking and seeing things that aren't really there. Pt also reported that he started back drinking daily and using cocaine several times a week. . Pt reported that he an ACTTeam with [facility] and receives medication management there as well. Pt has had several psychiatric hospitalizations. PT reported that he is dealing with multiple stressors such as the death of his father, financial and relationship conflict. Pt reported that this sleep and appetite have been poor. Pt stated "I go to sleep and I wake up swinging".   Prozac, Haldol, Trazodone trial  Estimated length of stay: 1-3 days  New goal(s):  Review of initial/current patient goals per problem list:   Review of initial/current patient goals per problem list:  1. Goal(s): Patient will participate in aftercare plan   Met: Yes    Target date: 3-5 days post admission date   As evidenced by: Patient will participate within aftercare plan AEB aftercare provider and housing plan at discharge being identified. 12/19/14:  Return home, follow up [facility]   2. Goal (s): Patient will exhibit decreased depressive symptoms and suicidal ideations.   Met: No   Target date: 3-5 days post admission date   As evidenced by: Patient will utilize self rating of depression at 3 or below and demonstrate decreased signs of depression or be deemed stable for discharge by MD. 12/19/14:  Rates depression at a 6    5. Goal(s): Patient will demonstrate decreased signs of psychosis  * Met: No  * Target date: 3-5 days post admission date  * As evidenced by: Patient will demonstrate decreased frequency of AVH or return to baseline function 12/19/14:  Pt states he has not had meds for a couple of months 'because the Dr. Is not available when I am working"  Continues to endorse [name] today           Attendees: Patient:  12/19/2014 11:23 AM   Family:   12/19/2014 11:23 AM   Physician:  Zachary Alert, MD 12/19/2014 11:23 AM   Nursing:   Hedy Jacob, RN 12/19/2014 11:23 AM   CSW:    Roque Lias, [place]   12/19/2014 11:23 AM   Other:  12/19/2014 11:23 AM   Other:   12/19/2014 11:23 AM   Other:  Lars Pinks, Nurse CM 12/19/2014 11:23 AM   Other:   12/19/2014 11:23 AM   Other:  Norberto Sorenson, [place]  12/19/2014 11:23 AM   Other:  12/19/2014 11:23 AM   Other:  12/19/2014 11:23 AM   Other:  12/19/2014 11:23 AM   Other:  12/19/2014 11:23 AM   Other:  12/19/2014 11:23 AM   Other:   12/19/2014 11:23 AM    Scribe for Treatment Team:   Trish Mage, 12/19/2014 11:23 AM

## 2014-12-19 NOTE — Plan of Care (Signed)
Problem: Ineffective individual coping Goal: STG: Patient will remain free from self harm Outcome: Progressing Pt safe on the unit     

## 2014-12-19 NOTE — [facility] Group Notes (Signed)
[facility] Group Notes:  (Counselor/Nursing/MHT/Case Management/Adjunct)  12/19/2014 1:15PM  Type of Therapy:  Group Therapy  Participation Level:  Active  Participation Quality:  Appropriate  Affect:  Flat  Cognitive:  Oriented  Insight:  Improving  Engagement in Group:  Limited  Engagement in Therapy:  Limited  Modes of Intervention:  Discussion, Exploration and Socialization  Summary of Progress/Problems: The topic for group was balance in life.  Pt participated in the discussion about when their life was in balance and out of balance and how this feels.  Pt discussed ways to get back in balance and short term goals they can work on to get where they want to be. Came late.  Initially reluctant to participate, but eventually warmed up.  Told the group he is struggling with a "broken heart" due to recent break up, and he recently relapsed as a result "because I don't like feeling this way."  Problem solveda bout how to get through this, and what has allowed him to remain clean and sober in the past.  He has goals for himself based on past bad events-he was homeless and had no income.  He is not working and has his own apartment, and is motivated to not lose those.   Zachary Hull, Anaily Ashbaugh B 12/19/2014 3:27 PM

## 2014-12-20 LAB — TSH: TSH: 0.449 u[IU]/mL (ref 0.350–4.500)

## 2014-12-20 MED ORDER — HALOPERIDOL 5 MG PO TABS: 5.0000 mg | ORAL_TABLET | Freq: Two times a day (BID) | ORAL | Status: DC

## 2014-12-20 MED ORDER — TRAZODONE HCL 100 MG PO TABS: 200.0000 mg | ORAL_TABLET | Freq: Every day | ORAL | Status: DC

## 2014-12-20 MED ORDER — NICOTINE 21 MG/24HR TD PT24: 21.0000 mg | MEDICATED_PATCH | Freq: Every day | TRANSDERMAL | Status: DC

## 2014-12-20 MED ORDER — FLUOXETINE HCL 20 MG PO CAPS: 20.0000 mg | ORAL_CAPSULE | Freq: Every day | ORAL | Status: DC

## 2014-12-20 MED ORDER — BENZTROPINE MESYLATE 0.5 MG PO TABS: 0.5000 mg | ORAL_TABLET | Freq: Two times a day (BID) | ORAL | Status: DC

## 2014-12-20 NOTE — Progress Notes (Signed)
Discharge note: Pt discharged per MD order. Discharge summary reviewed with pt. Pt verbalizes understanding of discharge summary. RX given. Pt denies SI/HI, AVH at discharge. All items returned to pt from locker # 14. Pt verbalizes and signs that he received all personal items. Pt ambulatory out of facility. ACT team personal in lobby for discharge.
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
## 2014-12-20 NOTE — B[facility] Group Notes (Signed)
[facility] Group Notes:  (Nursing/MHT/Case Management/Adjunct)  Date:  12/20/2014  Time:  11:17 AM  Type of Therapy:  Nurse Education  Participation Level:    Participation Quality:    Affect:    Cognitive:    Insight:    Engagement in Group:    Modes of Intervention:  Discussion and Education  Summary of Progress/Problems:  Group discussion was sleep hygiene, importance in communication with medical providers and positive support systems.  [name] did not attend group.  Norm ParcelHeather V Kare Dado 12/20/2014, 11:17 AM

## 2014-12-20 NOTE — Tx Team (Signed)
Interdisciplinary Treatment Plan Update (Adult)  Date:  12/20/2014   Time Reviewed:  2:50 PM   Progress in Treatment: Attending groups: Yes. Participating in groups:  Yes. Taking medication as prescribed:  Yes. Tolerating medication:  Yes. Family/Significant other contact made:  No Patient understands diagnosis:  Yes  As evidenced by seeking help with SI, psychosis Discussing patient identified problems/goals with staff:  Yes, see initial care plan. Medical problems stabilized or resolved:  Yes. Denies suicidal/homicidal ideation: Yes. Issues/concerns per patient self-inventory:  No. Other:  New problem(s) identified:  Discharge Plan or Barriers: see below  Reason for Continuation of Hospitalization:   Comments:  Zachary Hull is an 54 y.o. male presenting to [facility] reporting suicidal ideations with a plan to harm himself with a knife. Pt stated "I haven't been taking my meds for about 1 month because it upsets my stomach". Pt also reported that his father passed away approximately 2 weeks ago and since then he has been having mood swings, changes in his thinking and seeing things that aren't really there. Pt also reported that he started back drinking daily and using cocaine several times a week. . Pt reported that he an ACTTeam with [facility] and receives medication management there as well. Pt has had several psychiatric hospitalizations. PT reported that he is dealing with multiple stressors such as the death of his father, financial and relationship conflict. Pt reported that this sleep and appetite have been poor. Pt stated "I go to sleep and I wake up swinging".   Prozac, Haldol, Trazodone trial  Estimated length of stay: D/C today  New goal(s):  Review of initial/current patient goals per problem list:   Review of initial/current patient goals per problem list:  1. Goal(s): Patient will participate in aftercare plan   Met: Yes   Target date: 3-5 days post admission  date   As evidenced by: Patient will participate within aftercare plan AEB aftercare provider and housing plan at discharge being identified. 12/19/14:  Return home, follow up [facility]   2. Goal (s): Patient will exhibit decreased depressive symptoms and suicidal ideations.   Met: No   Target date: 3-5 days post admission date   As evidenced by: Patient will utilize self rating of depression at 3 or below and demonstrate decreased signs of depression or be deemed stable for discharge by MD. 12/19/14:  Rates depression at a 6 12/20/14:  Pt denies depression today    5. Goal(s): Patient will demonstrate decreased signs of psychosis  * Met: Yes  * Target date: 3-5 days post admission date  * As evidenced by: Patient will demonstrate decreased frequency of AVH or return to baseline function 12/19/14:  Pt states he has not had meds for a couple of months 'because the Dr. Is not available when I am working"  Continues to endorse [place] today 12/20/14:  Denies all symptoms today           Attendees: Patient:  12/20/2014 2:50 PM   Family:   12/20/2014 2:50 PM   Physician:  Ursula Alert, MD 12/20/2014 2:50 PM   Nursing:   Hedy Jacob, RN 12/20/2014 2:50 PM   CSW:    Roque Lias, LCSW   12/20/2014 2:50 PM   Other:  12/20/2014 2:50 PM   Other:   12/20/2014 2:50 PM   Other:  Lars Pinks, Nurse CM 12/20/2014 2:50 PM   Other:   12/20/2014 2:50 PM   Other:  Norberto Sorenson, [place]  12/20/2014 2:50 PM  Other:  12/20/2014 2:50 PM   Other:  12/20/2014 2:50 PM   Other:  12/20/2014 2:50 PM   Other:  12/20/2014 2:50 PM   Other:  12/20/2014 2:50 PM   Other:   12/20/2014 2:50 PM    Scribe for Treatment Team:   Trish Mage, 12/20/2014 2:50 PM

## 2014-12-20 NOTE — Progress Notes (Signed)
D: Pt denies SI/HI/AVH. Pt is pleasant and cooperative. Pt isolated to room entire evening , pt forwards little information. Pt wants to leave.   A: Pt was offered support and encouragement. Pt was encourage to attend groups. Q 15 minute checks were done for safety.  R: Pt is taking medication. Pt has no complaints.Pt receptive to treatment and safety maintained on unit.

## 2014-12-20 NOTE — Progress Notes (Signed)
  [facility] :  Will you be returning to the same living situation after discharge:  Yes,  home At discharge, do you have transportation home?: Yes,  ACT team Do you have the ability to pay for your medications: Yes,  MCD  Release of information consent forms completed and in the chart;  Patient's signature needed at discharge.  Patient to Follow up at: Follow-up Information    [facility].   Why:  Someone from ACT team will pick you up the day of d/c   Contact information:   [address]  , [place] [address] Phone: [phone] Fax: [phone]      Next level of care provider [facility] Link: unknown  Patient denies SI/HI: Yes,  yes    Safety Planning and Suicide Prevention discussed: Yes,  yes  Have you used any form of tobacco in the last 30 days? (Cigarettes, Smokeless Tobacco, Cigars, and/or Pipes): Yes  Has patient been referred to the Quitline?: Yes, faxed on 12/20/14  Ida Rogueorth, [place] B 12/20/2014, 2:51 PM

## 2014-12-20 NOTE — Plan of Care (Signed)
Problem: Alteration in mood & ability to function due to Goal: LTG-Pt reports reduction in suicidal thoughts (Patient reports reduction in suicidal thoughts and is able to verbalize a safety plan for whenever patient is feeling suicidal)  Outcome: Progressing Pt denies SI at this time     

## 2014-12-20 NOTE — Progress Notes (Signed)
D:Per patient self inventory form pt reports  He slept good last night. He reports a fair appetite, normal energy level, good concentration. He rates depression 5/10, hopelessness 5/10, anxiety 5/10- all on 0-10 scale, 10 being the worse.  Pt denies s/s of withdrawal. Pt rates depression 5/10, hopelessness 5/10, anxiety 5/10- all on 0-10 scale, 10 being the worse. Pt reports his goal is "stay substance free." and that he will "pray and focus on self" to meet his goal. Pt denies physical pain. Pt denies SI/HI. Denies AVH. Pt eager for discharge  A:Special checks q 15 mins in place for safety. Medication administered per MD order (see eMAR). Encouragement and support provided. Discharge planning in place.  R:Safety maintained. Compliant with medication regimen. Will continue to monitor.

## 2014-12-20 NOTE — BHH Suicide Risk Assessment (Signed)
[facility] Discharge Suicide Risk Assessment   Demographic Factors:  Male  Total Time spent with patient: 30 minutes  Musculoskeletal: Strength & Muscle Tone: within normal limits Gait & Station: normal Patient leans: N/A  Psychiatric Specialty Exam: Physical Exam  Review of Systems  Psychiatric/Behavioral: Positive for substance abuse.  All other systems reviewed and are negative.   Blood pressure 126/92, pulse 81, temperature 98 F (36.7 C), temperature source Oral, resp. rate 15, height 5\' 5"  (1.651 m), weight 76.658 kg (169 lb), SpO2 100 %.Body mass index is 28.12 kg/(m^2).  General Appearance: Casual  Eye Contact::  Fair  Speech:  Clear and Coherent409  Volume:  Normal  Mood:  Euthymic  Affect:  Appropriate  Thought Process:  Coherent  Orientation:  Full (Time, Place, and Person)  Thought Content:  WDL  Suicidal Thoughts:  No  Homicidal Thoughts:  No  Memory:  Immediate;   Fair Recent;   Fair Remote;   Fair  Judgement:  Fair  Insight:  Fair  Psychomotor Activity:  Normal  Concentration:  Fair  Recall:  FiservFair  Fund of Knowledge:Fair  Language: Fair  Akathisia:  No  Handed:  Right  AIMS (if indicated):     Assets:  Communication Skills Desire for Improvement  Sleep:  Number of Hours: 6.75  Cognition: WNL  ADL's:  Intact   Have you used any form of tobacco in the last 30 days? (Cigarettes, Smokeless Tobacco, Cigars, and/or Pipes): Yes  Has this patient used any form of tobacco in the last 30 days? (Cigarettes, Smokeless Tobacco, Cigars, and/or Pipes) Yes, Prescription provided for nicotine patch.  Mental Status Per Nursing Assessment::   On Admission:  NA  Current Mental Status by Physician: pt denies SI/HI/AH/VH  Loss Factors: NA  Historical Factors: Impulsivity  Risk Reduction Factors:   Positive social support  Continued Clinical Symptoms:  Alcohol/Substance Abuse/Dependencies Previous Psychiatric Diagnoses and Treatments  Cognitive Features That  Contribute To Risk:  None    Suicide Risk:  Minimal: No identifiable suicidal ideation.  Patients presenting with no risk factors but with morbid ruminations; may be classified as minimal risk based on the severity of the depressive symptoms  Principal Problem: Schizophrenia, acute undifferentiated [facility](HCC) Discharge Diagnoses:  Patient Active Problem List   Diagnosis Date Noted  . Cannabis use disorder, moderate, dependence (HCC) [F12.20] 12/19/2014  . 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 Alcohol use disorder, severe, dependence (HCC) [F10.20] 12/16/2014  . Cocaine abuse [F14.10] 03/10/2012    Follow-up Information    [facility]   Why:  Resume ACT team services   Contact information:   [address]  [place], [place] [address] Phone: [phone] Fax: [phone]      Plan Of Care/Follow-up recommendations:  Activity:  No restrictions Diet:  regular Tests:  as needed Other:  follow up with after care  Is patient on multiple antipsychotic therapies at discharge:  No   Has Patient had three or more failed trials of antipsychotic monotherapy by history:  No  Recommended Plan for Multiple Antipsychotic Therapies: NA    Mery Guadalupe MD 12/20/2014, 9:26 AM

## 2014-12-21 LAB — PROLACTIN: Prolactin: 40.7 ng/mL — ABNORMAL HIGH (ref 4.0–15.2)

## 2015-01-25 ENCOUNTER — Encounter (HOSPITAL_COMMUNITY): Payer: Self-pay | Admitting: *Deleted

## 2015-01-25 ENCOUNTER — Encounter (HOSPITAL_COMMUNITY): Payer: Self-pay | Admitting: Emergency Medicine

## 2015-01-25 ENCOUNTER — Inpatient Hospital Stay (HOSPITAL_COMMUNITY)
Admission: AD | Admit: 2015-01-25 | Discharge: 2015-01-29 | DRG: 885 | Disposition: A | Discharge status: Home / Self Care | Payer: Medicaid Other | Source: Intra-hospital | Attending: Psychiatry | Admitting: Psychiatry

## 2015-01-25 ENCOUNTER — Emergency Department (HOSPITAL_COMMUNITY)
Admission: EM | Admit: 2015-01-25 | Discharge: 2015-01-25 | Disposition: A | Discharge status: Other Acute Inpatient Hospital | Payer: Medicaid Other | Attending: Emergency Medicine | Admitting: Emergency Medicine

## 2015-01-25 DIAGNOSIS — F1220 Cannabis dependence, uncomplicated: Secondary | ICD-10-CM | POA: Diagnosis not present

## 2015-01-25 DIAGNOSIS — Z8249 Family history of ischemic heart disease and other diseases of the circulatory system: Secondary | ICD-10-CM | POA: Diagnosis not present

## 2015-01-25 DIAGNOSIS — F203 Undifferentiated schizophrenia: Principal | ICD-10-CM | POA: Diagnosis present

## 2015-01-25 DIAGNOSIS — F17210 Nicotine dependence, cigarettes, uncomplicated: Secondary | ICD-10-CM | POA: Insufficient documentation

## 2015-01-25 DIAGNOSIS — F419 Anxiety disorder, unspecified: Secondary | ICD-10-CM | POA: Insufficient documentation

## 2015-01-25 DIAGNOSIS — F1020 Alcohol dependence, uncomplicated: Secondary | ICD-10-CM | POA: Diagnosis present

## 2015-01-25 DIAGNOSIS — R45851 Suicidal ideations: Secondary | ICD-10-CM | POA: Diagnosis present

## 2015-01-25 DIAGNOSIS — F329 Major depressive disorder, single episode, unspecified: Secondary | ICD-10-CM | POA: Diagnosis present

## 2015-01-25 DIAGNOSIS — F1420 Cocaine dependence, uncomplicated: Secondary | ICD-10-CM | POA: Diagnosis present

## 2015-01-25 DIAGNOSIS — Z818 Family history of other mental and behavioral disorders: Secondary | ICD-10-CM | POA: Diagnosis not present

## 2015-01-25 DIAGNOSIS — F251 Schizoaffective disorder, depressive type: Secondary | ICD-10-CM | POA: Diagnosis present

## 2015-01-25 DIAGNOSIS — Z9114 Patient's other noncompliance with medication regimen: Secondary | ICD-10-CM

## 2015-01-25 DIAGNOSIS — F1410 Cocaine abuse, uncomplicated: Secondary | ICD-10-CM | POA: Diagnosis present

## 2015-01-25 DIAGNOSIS — Z8659 Personal history of other mental and behavioral disorders: Secondary | ICD-10-CM

## 2015-01-25 DIAGNOSIS — Z8701 Personal history of pneumonia (recurrent): Secondary | ICD-10-CM | POA: Insufficient documentation

## 2015-01-25 HISTORY — DX: Pneumonia, unspecified organism: J18.9

## 2015-01-25 LAB — COMPREHENSIVE METABOLIC PANEL
ALBUMIN: 5 g/dL (ref 3.5–5.0)
ALK PHOS: 69 U/L (ref 38–126)
ALT: 29 U/L (ref 17–63)
ANION GAP: 13 (ref 5–15)
AST: 24 U/L (ref 15–41)
BUN: 8 mg/dL (ref 6–20)
CALCIUM: 10 mg/dL (ref 8.9–10.3)
CO2: 22 mmol/L (ref 22–32)
Chloride: 105 mmol/L (ref 101–111)
Creatinine, Ser: 1 mg/dL (ref 0.61–1.24)
GFR calc Af Amer: >60 mL/min (ref >60)
GFR calc non Af Amer: >60 mL/min (ref >60)
GLUCOSE: 85 mg/dL (ref 65–99)
POTASSIUM: 4 mmol/L (ref 3.5–5.1)
SODIUM: 140 mmol/L (ref 135–145)
Total Bilirubin: 0.8 mg/dL (ref 0.3–1.2)
Total Protein: 7.6 g/dL (ref 6.5–8.1)

## 2015-01-25 LAB — RAPID URINE DRUG SCREEN, HOSP PERFORMED
AMPHETAMINES: NOT DETECTED
BENZODIAZEPINES: NOT DETECTED
Barbiturates: NOT DETECTED
Cocaine: POSITIVE — AB
OPIATES: NOT DETECTED
Tetrahydrocannabinol: NOT DETECTED

## 2015-01-25 LAB — CBC WITH DIFFERENTIAL/PLATELET
BASOS ABS: 0 10*3/uL (ref 0.0–0.1)
BASOS PCT: 0 %
EOS ABS: 0 10*3/uL (ref 0.0–0.7)
Eosinophils Relative: 0 %
HCT: 47.4 % (ref 39.0–52.0)
HEMOGLOBIN: 16.3 g/dL (ref 13.0–17.0)
Lymphocytes Relative: 37 %
Lymphs Abs: 3 10*3/uL (ref 0.7–4.0)
MCH: 33.1 pg (ref 26.0–34.0)
MCHC: 34.4 g/dL (ref 30.0–36.0)
MCV: 96.1 fL (ref 78.0–100.0)
MONOS PCT: 7 %
Monocytes Absolute: 0.6 10*3/uL (ref 0.1–1.0)
NEUTROS PCT: 56 %
Neutro Abs: 4.6 10*3/uL (ref 1.7–7.7)
Platelets: 248 10*3/uL (ref 150–400)
RBC: 4.93 MIL/uL (ref 4.22–5.81)
RDW: 13.8 % (ref 11.5–15.5)
WBC: 8.2 10*3/uL (ref 4.0–10.5)

## 2015-01-25 LAB — ETHANOL: ALCOHOL ETHYL (B): 83 mg/dL — AB (ref <5)

## 2015-01-25 LAB — SALICYLATE LEVEL: Salicylate Lvl: <4 mg/dL (ref 2.8–30.0)

## 2015-01-25 LAB — ACETAMINOPHEN LEVEL: Acetaminophen (Tylenol), Serum: <10 ug/mL — ABNORMAL LOW (ref 10–30)

## 2015-01-25 MED ORDER — ACETAMINOPHEN 325 MG PO TABS: 650.0000 mg | ORAL_TABLET | ORAL | Status: DC | PRN

## 2015-01-25 MED ORDER — ONDANSETRON HCL 4 MG PO TABS: 4.0000 mg | ORAL_TABLET | Freq: Three times a day (TID) | ORAL | Status: DC | PRN

## 2015-01-25 MED ORDER — LORAZEPAM 1 MG PO TABS
1.0000 mg | ORAL_TABLET | Freq: Three times a day (TID) | ORAL | Status: DC | PRN
  Administered 2015-01-25: 1 mg via ORAL
  Filled 2015-01-25: qty 1

## 2015-01-25 MED ORDER — HALOPERIDOL 5 MG PO TABS
5.0000 mg | ORAL_TABLET | Freq: Two times a day (BID) | ORAL | Status: DC
  Administered 2015-01-25: 5 mg via ORAL
  Filled 2015-01-25: qty 1

## 2015-01-25 MED ORDER — NICOTINE 21 MG/24HR TD PT24
21.0000 mg | MEDICATED_PATCH | Freq: Every day | TRANSDERMAL | Status: DC
  Administered 2015-01-26 – 2015-01-29 (×4): 21 mg via TRANSDERMAL
  Filled 2015-01-25 (×6): qty 1

## 2015-01-25 MED ORDER — HALOPERIDOL 5 MG PO TABS
5.0000 mg | ORAL_TABLET | Freq: Two times a day (BID) | ORAL | Status: DC
  Administered 2015-01-26: 5 mg via ORAL
  Filled 2015-01-25 (×5): qty 1

## 2015-01-25 MED ORDER — LORAZEPAM 1 MG PO TABS: 1.0000 mg | ORAL_TABLET | Freq: Three times a day (TID) | ORAL | Status: DC | PRN

## 2015-01-25 MED ORDER — TRAZODONE HCL 100 MG PO TABS
200.0000 mg | ORAL_TABLET | Freq: Every day | ORAL | Status: DC
Start: 2015-01-25 — End: 2015-01-25

## 2015-01-25 MED ORDER — NICOTINE 21 MG/24HR TD PT24
21.0000 mg | MEDICATED_PATCH | Freq: Every day | TRANSDERMAL | Status: DC
  Administered 2015-01-25: 21 mg via TRANSDERMAL
  Filled 2015-01-25: qty 1

## 2015-01-25 MED ORDER — FLUOXETINE HCL 20 MG PO CAPS
20.0000 mg | ORAL_CAPSULE | Freq: Every day | ORAL | Status: DC
  Administered 2015-01-26 – 2015-01-29 (×4): 20 mg via ORAL
  Filled 2015-01-25 (×6): qty 1

## 2015-01-25 MED ORDER — BENZTROPINE MESYLATE 1 MG PO TABS
0.5000 mg | ORAL_TABLET | Freq: Two times a day (BID) | ORAL | Status: DC
  Administered 2015-01-25: 0.5 mg via ORAL
  Filled 2015-01-25: qty 1

## 2015-01-25 MED ORDER — BENZTROPINE MESYLATE 0.5 MG PO TABS
0.5000 mg | ORAL_TABLET | Freq: Two times a day (BID) | ORAL | Status: DC
  Administered 2015-01-26: 0.5 mg via ORAL
  Filled 2015-01-25 (×5): qty 1

## 2015-01-25 MED ORDER — FLUOXETINE HCL 20 MG PO CAPS
20.0000 mg | ORAL_CAPSULE | Freq: Every day | ORAL | Status: DC
  Administered 2015-01-25: 20 mg via ORAL
  Filled 2015-01-25 (×2): qty 1

## 2015-01-25 MED ORDER — TRAZODONE HCL 100 MG PO TABS
200.0000 mg | ORAL_TABLET | Freq: Every day | ORAL | Status: DC
  Administered 2015-01-25: 200 mg via ORAL
  Filled 2015-01-25 (×3): qty 2

## 2015-01-25 MED ORDER — ONDANSETRON HCL 4 MG PO TABS
4.0000 mg | ORAL_TABLET | Freq: Three times a day (TID) | ORAL | Status: DC | PRN
Start: 2015-01-25 — End: 2015-01-25

## 2015-01-25 NOTE — BH Assessment (Signed)
[facility] by Dr. Jannifer FranklinAkintayo and Julieanne CottonJosephine, NP. Per [facility](AC), [id number]. Nursing report [phone]. Patient is currently under IVC and [facility]. Nursing staff made aware of patient's disposition.
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
## 2015-01-25 NOTE — Progress Notes (Signed)
Patient did not attend group, was sleeping. 
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
## 2015-01-25 NOTE — ED Notes (Signed)
PA at bedside.
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
## 2015-01-25 NOTE — ED Notes (Signed)
[facility] MD order for [facility]. Personal belongings given to [employer], pt signed for belongings at discharge. Pt ambulatory out of facility with [employer].

## 2015-01-25 NOTE — ED Notes (Signed)
Pt reporting increase anxiety. Reports his thoughts are racing. PRN Ativan offered and given.
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
## 2015-01-25 NOTE — ED Notes (Addendum)
Pt presents laying in his assigned bed. Reports SI. Pt is able to verbally contract for safety. Denies a plan to hurt self and reports he feels safe at the hospital. Denies HI. Denies AVH. Forwards little with this nurse. Denies s/s of withdrawal.  Compliant with medication regimen. Will continue to monitor .
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
## 2015-01-26 ENCOUNTER — Encounter (HOSPITAL_COMMUNITY): Payer: Self-pay | Admitting: Psychiatry

## 2015-01-26 DIAGNOSIS — R45851 Suicidal ideations: Secondary | ICD-10-CM

## 2015-01-26 DIAGNOSIS — F1220 Cannabis dependence, uncomplicated: Secondary | ICD-10-CM

## 2015-01-26 DIAGNOSIS — F1020 Alcohol dependence, uncomplicated: Secondary | ICD-10-CM

## 2015-01-26 DIAGNOSIS — F1420 Cocaine dependence, uncomplicated: Secondary | ICD-10-CM

## 2015-01-26 DIAGNOSIS — F203 Undifferentiated schizophrenia: Principal | ICD-10-CM

## 2015-01-26 MED ORDER — ENSURE ENLIVE PO LIQD
237.0000 mL | Freq: Two times a day (BID) | ORAL | Status: DC
  Administered 2015-01-26: 237 mL via ORAL

## 2015-01-26 MED ORDER — VITAMIN B-1 100 MG PO TABS
100.0000 mg | ORAL_TABLET | Freq: Every day | ORAL | Status: DC
  Administered 2015-01-27 – 2015-01-29 (×3): 100 mg via ORAL
  Filled 2015-01-26 (×5): qty 1

## 2015-01-26 MED ORDER — FLUPHENAZINE HCL 5 MG PO TABS
5.0000 mg | ORAL_TABLET | Freq: Every day | ORAL | Status: DC
  Administered 2015-01-26 – 2015-01-28 (×3): 5 mg via ORAL
  Filled 2015-01-26 (×5): qty 1

## 2015-01-26 MED ORDER — CHLORDIAZEPOXIDE HCL 25 MG PO CAPS
25.0000 mg | ORAL_CAPSULE | ORAL | Status: AC
Start: 2015-01-28 — End: 2015-01-28
  Administered 2015-01-28 (×2): 25 mg via ORAL
  Filled 2015-01-26 (×2): qty 1

## 2015-01-26 MED ORDER — CHLORDIAZEPOXIDE HCL 25 MG PO CAPS
25.0000 mg | ORAL_CAPSULE | Freq: Four times a day (QID) | ORAL | Status: AC
  Administered 2015-01-26 (×3): 25 mg via ORAL
  Filled 2015-01-26 (×3): qty 1

## 2015-01-26 MED ORDER — BENZTROPINE MESYLATE 0.5 MG PO TABS
0.5000 mg | ORAL_TABLET | Freq: Every day | ORAL | Status: DC
  Administered 2015-01-26 – 2015-01-28 (×3): 0.5 mg via ORAL
  Filled 2015-01-26 (×5): qty 1

## 2015-01-26 MED ORDER — LOPERAMIDE HCL 2 MG PO CAPS: 2.0000 mg | ORAL_CAPSULE | ORAL | Status: AC | PRN

## 2015-01-26 MED ORDER — MIRTAZAPINE 7.5 MG PO TABS
7.5000 mg | ORAL_TABLET | Freq: Every day | ORAL | Status: DC
  Administered 2015-01-26 – 2015-01-28 (×3): 7.5 mg via ORAL
  Filled 2015-01-26 (×6): qty 1

## 2015-01-26 MED ORDER — CHLORDIAZEPOXIDE HCL 25 MG PO CAPS
25.0000 mg | ORAL_CAPSULE | Freq: Every day | ORAL | Status: AC
  Administered 2015-01-29: 25 mg via ORAL
  Filled 2015-01-26: qty 1

## 2015-01-26 MED ORDER — THIAMINE HCL 100 MG/ML IJ SOLN
100.0000 mg | Freq: Once | INTRAMUSCULAR | Status: AC
  Administered 2015-01-26: 100 mg via INTRAMUSCULAR
  Filled 2015-01-26: qty 2

## 2015-01-26 MED ORDER — ADULT MULTIVITAMIN W/MINERALS CH
1.0000 | ORAL_TABLET | Freq: Every day | ORAL | Status: DC
  Administered 2015-01-26 – 2015-01-29 (×4): 1 via ORAL
  Filled 2015-01-26 (×6): qty 1

## 2015-01-26 MED ORDER — CHLORDIAZEPOXIDE HCL 25 MG PO CAPS
25.0000 mg | ORAL_CAPSULE | Freq: Three times a day (TID) | ORAL | Status: AC
Start: 2015-01-27 — End: 2015-01-27
  Administered 2015-01-27 (×3): 25 mg via ORAL
  Filled 2015-01-26 (×3): qty 1

## 2015-01-26 MED ORDER — CHLORDIAZEPOXIDE HCL 25 MG PO CAPS: 25.0000 mg | ORAL_CAPSULE | Freq: Four times a day (QID) | ORAL | Status: AC | PRN

## 2015-01-26 MED ORDER — HYDROXYZINE HCL 25 MG PO TABS: 25.0000 mg | ORAL_TABLET | Freq: Four times a day (QID) | ORAL | Status: AC | PRN

## 2015-01-26 NOTE — [facility] Group Notes (Signed)
[facility] Group Notes:  (Nursing/MHT/Case Management/Adjunct)  Date:  01/26/2015  Time:  10:47 AM  Type of Therapy:  Nurse Education  Participation Level:  Did Not Attend  Participation Quality:    Affect:    Cognitive:    Insight:    Engagement in Group:    Modes of Intervention:    Summary of Progress/Problems:  Did not attend after much encouragement.   Norm ParcelHeather V Kashayla Ungerer 01/26/2015, 10:47 AM
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
## 2015-01-26 NOTE — [facility] Counselor (Signed)
Adult Comprehensive Assessment  Patient ID: Zachary Hull, male DOB: 09/06/1960, 55 y.o. MRN: [id number]  Information Source: Information source: Patient  Current Stressors:  Employment / Job issues: on disability Family Relationships: no family/friend support Surveyor, quantity / Lack of resources (include bankruptcy): on fixed income Bereavement / Loss: both mother and father passed in past 6 years Substance Abuse:  Drinking daily, undetermined amount of cocaine use over the past 6 weeks Living/Environment/Situation:  Living Arrangements: Alone Living conditions (as described by patient or guardian): Pt lives alone in [place]. Pt reports it's a decent environment.  How long has patient lived in current situation?: 4 months What is atmosphere in current home: Comfortable  Family History:  Marital status: Divorced Divorced, when?: last year What types of issues is patient dealing with in the relationship?: pt reports he was drinking and his wife was tired of it Additional relationship information: N/A Are you sexually active?: No What is your sexual orientation?: heterosexual Has your sexual activity been affected by drugs, alcohol, medication, or emotional stress?: no Does patient have children?: No  Childhood History:  By whom was/is the patient raised?: Grandparents Additional childhood history information: Pt reports having a great childhood. Pt states that he was raised by his grandparents because his parents worked.  Description of patient's relationship with caregiver when they were a child: Pt reports getting along great with grandparents growing up.  Patient's description of current relationship with people who raised him/her: Grandparents and parents are deceased.  How were you disciplined when you got in trouble as a child/adolescent?: talked to Does patient have siblings?: No Did patient suffer any verbal/emotional/physical/sexual abuse as a child?: No Did  patient suffer from severe childhood neglect?: No Has patient ever been sexually abused/assaulted/raped as an adolescent or adult?: No Was the patient ever a victim of a crime or a disaster?: No Witnessed domestic violence?: No Has patient been effected by domestic violence as an adult?: No  Education:  Highest grade of school patient has completed: graduated high school Currently a Consulting civil engineer?: No Learning disability?: No  Employment/Work Situation:  Employment situation: On disability Why is patient on disability: mental health How long has patient been on disability: since 2009 Patient's job has been impacted by current illness: No What is the longest time patient has a held a job?: 2 years Where was the patient employed at that time?: [employer] Has patient ever been in the [employer]?: No Has patient ever served in combat?: No Did You Receive Any Psychiatric Treatment/Services While in Equities trader?: No Are There Guns or Other Weapons in Your Home?: No Are These Weapons Safely Secured?: Yes  Financial Resources:  Financial resources: [employer], Medicaid Does patient have a Lawyer or guardian?: No  Alcohol/Substance Abuse:  What has been your use of drugs/alcohol within the last 12 months?: Alcohol - 1/5th bottle liquor every 2 days  Cocaine - "Occasionally" Reports being clean and sober 2 years prior to use befginning in November of this year If attempted suicide, did drugs/alcohol play a role in this?: Yes Alcohol/Substance Abuse Treatment Hx: Past Tx, Inpatient, Attends AA/NA, Past detox, Past Tx, Outpatient If yes, describe treatment: Reports being inpatient numerous times for his drug/alcohol use - unable to name the centers Has alcohol/substance abuse ever caused legal problems?: Yes (DUI in 1999)  Social Support System:  Patient's Community Support System: Good Describe Community Support System: pt reports his ACT team is his main support.  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 In what areas does patient struggle / problems for patient: depression, SI  Discharge Plan:  Does patient have access to transportation?: Yes Will patient be returning to same living situation after discharge?: Yes Currently receiving community mental health services: Yes (From Whom) [facility]) If no, would patient like referral for services when discharged?: Yes (What county?) [facility]([place]) Does patient have financial barriers related to discharge medications?: No  Summary/Recommendations: Zachary Hull is a 55 year old African American Male with a diagnosis of Schizophrenia and substance abuse, recently discharged on 12/20/14 for depression, SI and substance abuse who returns with same complaints.   [place], [facility], and returns to us with the same complaints as last admission.  He states that the fall, and especially the holidays, are a difficult time for him since his mother passed in 2011, and now his father more recently.He admits to drinking liquor daily, and while he is vague on cocaine use, the fact that he was positive for the drug at both admissions would indicate use that is more frequently than occasional. He signed a release for [facility] to see if he might be able to get into that program from here.Zachary Hull has been homeless in the past, and despite his use of liquor and cocaine has been paying his rent as he is motivated to live in his own place.  Patient will benefit from crisis stabilization, medication evaluation, group therapy and psycho education in addition to case management for discharge planning.    Pt is a smoker but declines [place] Quitline at this time.   [name], Sundee Garland 01/26/2015

## 2015-01-26 NOTE — Progress Notes (Addendum)
Patient did not attend wrap-up group. Patient was sleep.  

## 2015-01-26 NOTE — [facility] Group Notes (Signed)
[facility] LCSW Group Therapy  01/26/2015 1:15 pm  Type of Therapy: Process Group Therapy  Participation Level:  Active  Participation Quality:  Appropriate  Affect:  Flat  Cognitive:  Oriented  Insight:  Improving  Engagement in Group:  Limited  Engagement in Therapy:  Limited  Modes of Intervention:  Activity, Clarification, Education, Problem-solving and Support  Summary of Progress/Problems: Today's group addressed the issue of overcoming obstacles.  Patients were asked to identify their biggest obstacle post d/c that stands in the way of their on-going success, and then problem solve as to how to manage this.  Invited.  Chose to not attend  Zachary Hull, Zachary Hull B 01/26/2015   4:26 PM

## 2015-01-26 NOTE — Tx Team (Signed)
Interdisciplinary Treatment Plan Update (Adult)  Date:  01/26/2015   Time Reviewed:  10:25 AM   Progress in Treatment: Attending groups: Yes. Participating in groups:  Yes. Taking medication as prescribed:  Yes. Tolerating medication:  Yes. Family/Significant other contact made:  No Patient understands diagnosis:  Yes  As evidenced by seeking help with SI, psychosis Discussing patient identified problems/goals with staff:  Yes, see initial care plan. Medical problems stabilized or resolved:  Yes. Denies suicidal/homicidal ideation: Yes. Issues/concerns per patient self-inventory:  No. Other:  New problem(s) identified:  Discharge Plan or Barriers: see below  Reason for Continuation of Hospitalization: Hallucinations Medication stabilization  Depression Withdrawal symptoms  Comments:  Zachary Hull is an 55 y.o. male presenting to [facility] reporting suicidal ideations with a plan to harm himself with a knife.States he has been depressed and experiencing AH over the holidays due to loss of both parents, primarily mother who died on Christmas day several years ago.  Pt also reported that he started back drinking daily and using cocaine several times a week. . Pt reported that he an ACTTeam with [facility] and receives medication management there as well. Pt has had several psychiatric hospitalizations. PT reported that he is dealing with multiple stressors such as the death of his parents, financial and relationship conflict. Pt reported that this sleep and appetite have been poor. Pt stated "I go to sleep and I wake up swinging".   Prolixin, Remeron, Prozac trial with librium detox protocol Estimated length of stay: 4-5 days  New goal(s):  Review of initial/current patient goals per problem list:   Review of initial/current patient goals per problem list:  1. Goal(s): Patient will participate in aftercare plan   Met: Yes   Target date: 3-5 days post admission date   As evidenced  by: Patient will participate within aftercare plan AEB aftercare provider and housing plan at discharge being identified. 01/26/15:  Plan A:  [facility]  Plan B:  Return home, follow up [facility]   2. Goal (s): Patient will exhibit decreased depressive symptoms and suicidal ideations.   Met: No   Target date: 3-5 days post admission date   As evidenced by: Patient will utilize self rating of depression at 3 or below and demonstrate decreased signs of depression or be deemed stable for discharge by MD. 01/26/15:  Rates depression at a 10  4. Goal(s): Patient will demonstrate decreased signs of withdrawal due to substance abuse   Met: No   Target date: 3-5 days post admission date   As evidenced by: Patient will produce a CIWA/COWS score of 0, have stable vitals signs, and no symptoms of withdrawal 01/26/15:  Librium detox protocol  5. Goal(s): Patient will demonstrate decreased signs of psychosis  * Met: No  * Target date: 3-5 days post admission date  * As evidenced by: Patient will demonstrate decreased frequency of AVH or return to baseline function 01/26/15:  Pt endorses AH of mother telling him to come join him.  He states he stopped taking meds post d/c because he did not like the way they made him feel.  Apparently did not talk to ACT team about his concerns.  Will change from haldol to prolixin           Attendees: Patient:  01/26/2015 10:25 AM   Family:   01/26/2015 10:25 AM   Physician:  Ursula Alert, MD 01/26/2015 10:25 AM   Nursing:   Hedy Jacob, RN 01/26/2015 10:25 AM   CSW:  [place], [place]   01/26/2015 10:25 AM   Other:  01/26/2015 10:25 AM   Other:   01/26/2015 10:25 AM   Other:  Lars Pinks, Nurse CM 01/26/2015 10:25 AM   Other:   01/26/2015 10:25 AM   Other:  Norberto Sorenson, [place]  01/26/2015 10:25 AM   Other:  01/26/2015 10:25 AM   Other:  01/26/2015 10:25 AM   Other:  01/26/2015 10:25 AM   Other:  01/26/2015 10:25 AM   Other:  01/26/2015 10:25 AM   Other:    01/26/2015 10:25 AM    Scribe for Treatment Team:   Trish Mage, 01/26/2015 10:25 AM

## 2015-01-26 NOTE — Progress Notes (Signed)
DAR NOTE: Patient remained isolative to his room.  Affect is flat and mood is depressed.  Denies auditory and visual hallucinations.  Reports withdrawal symptoms of tremors, diarrhea and nausea on self inventory form.  Reports suicidal thoughts on self inventory form but contracts for safety.  Describes energy level as low and concentration as poor.  Rates depression at 5, hopelessness at 5, and anxiety at 5.  Maintained on routine safety checks.  Medications given as prescribed.  Support and encouragement offered as needed. States goal for today is "stay clean."

## 2015-01-26 NOTE — [facility] Suicide Risk Assessment (Signed)
[facility]   Nursing information obtained from:  Patient Demographic factors:  Male, Living alone Current Mental Status:  NA Loss Factors:  Loss of significant relationship Historical Factors:  Prior suicide attempts Risk Reduction Factors:  Positive social support Total Time spent with patient: 30 minutes Principal Problem: Schizophrenia, undifferentiated (HCC) Diagnosis:   Patient Active Problem List   Diagnosis Date Noted  . Cocaine use disorder, severe, dependence (HCC) [F14.20] 01/26/2015  . Schizophrenia, undifferentiated (HCC) [F20.3] 01/25/2015  . Cannabis use disorder, moderate, dependence (HCC) [F12.20] 12/19/2014  . Alcohol use disorder, severe, dependence (HCC) [F10.20] 12/16/2014     Continued Clinical Symptoms:  Alcohol Use Disorder Identification Test Final Score (AUDIT): 25 The "Alcohol Use Disorders Identification Test", Guidelines for Use in Primary Care, Second Edition.  World Science writerHealth Organization [facility](WHO). Score between 0-7:  no or low risk or alcohol related problems. Score between 8-15:  moderate risk of alcohol related problems. Score between 16-19:  high risk of alcohol related problems. Score 20 or above:  warrants further diagnostic evaluation for alcohol dependence and treatment.   CLINICAL FACTORS:   Alcohol/Substance Abuse/Dependencies Unstable or Poor Therapeutic Relationship Previous Psychiatric Diagnoses and Treatments   Musculoskeletal: Strength & Muscle Tone: within normal limits Gait & Station: normal Patient leans: N/A  Psychiatric Specialty Exam: Physical Exam  Review of Systems  Psychiatric/Behavioral: Positive for depression, suicidal ideas, hallucinations and substance abuse. The patient is nervous/anxious and has insomnia.   All other systems reviewed and are negative.   Blood pressure 111/74, pulse 84, temperature 98 F (36.7 C), temperature source Oral, resp. rate 16, height 5\' 6"  (1.676 m), weight  76.658 kg (169 lb).Body mass index is 27.29 kg/(m^2).                    Please see H&P.                                      COGNITIVE FEATURES THAT CONTRIBUTE TO RISK:  Closed-mindedness, Polarized thinking and Thought constriction (tunnel vision)    SUICIDE RISK:   Moderate:  Frequent suicidal ideation with limited intensity, and duration, some specificity in terms of plans, no associated intent, good self-control, limited dysphoria/symptomatology, some risk factors present, and identifiable protective factors, including available and accessible social support.  PLAN OF CARE: Please see H&P.   Medical Decision Making:  Review of Psycho-Social Stressors (1), Review or order clinical lab tests (1), Decision to obtain old records (1), Review and summation of old records (2), Review of Last Therapy Session (1), Review or order medicine tests (1) and Review of New Medication or Change in Dosage (2)  I certify that inpatient services furnished can reasonably be expected to improve the patient's condition.   Marnette Perkins MD 01/26/2015, 9:50 AM

## 2015-01-26 NOTE — Progress Notes (Signed)
D: Pt was practically in bed resting with eyes close and snoring through the night. Pt was awake long enough to endorse depression. Pt denied SI/HI pain, AVH and pain. Pt is flat with minimal eye contact. A: Medications offered as prescribed.  Support, encouragement, and safe environment provided.  15-minute safety checks continue. R: Pt was med compliant.  Pt did not attend group. Safety checks continue.

## 2015-01-26 NOTE — Progress Notes (Signed)
NUTRITION ASSESSMENT  Pt identified as at risk on the Malnutrition Screen Tool  INTERVENTION: 1. Educated patient on the importance of nutrition and encouraged intake of food and beverages. 2. Discussed weight goals. 3. Supplements: will order Ensure Enlive po BID, each supplement provides 350 kcal and 20 grams of protein  NUTRITION DIAGNOSIS: Inadequate oral intake related to depression and heavy daily alcohol consumption as evidenced by pt report.  Goal: Pt to meet >/= 90% of their estimated nutrition needs.  Monitor:  PO intake  Assessment:  Pt seen for MST. Pt admitted for SI with depression and off of his psych medications for several months. Per notes, pt has been experiencing auditory and visual hallucinations. He drinks heavily daily.   Per report, pt has not been eating well for ~3 months due to above listed items. Chart review indicates stable weight since August 2016 but may not be reflective of malnutrition as pt has been drinking but not eating. Will order Ensure to assist.  55 y.o. male  Height: Ht Readings from Last 1 Encounters:  01/25/15 5\' 6"  (1.676 m)    Weight: Wt Readings from Last 1 Encounters:  01/25/15 169 lb (76.658 kg)    Weight Hx: Wt Readings from Last 10 Encounters:  01/25/15 169 lb (76.658 kg)  12/17/14 169 lb (76.658 kg)  08/23/14 168 lb (76.204 kg)  03/09/12 152 lb (68.947 kg)    BMI:  Body mass index is 27.29 kg/(m^2). Pt meets criteria for overweight based on current BMI.  Estimated Nutritional Needs: Kcal: 25-30 kcal/kg Protein: > 1 gram protein/kg Fluid: 1 ml/kcal  Diet Order: Diet regular Room service appropriate?: Yes; Fluid consistency:: Thin Pt is also offered choice of unit snacks mid-morning and mid-afternoon.  Pt is eating as desired.   Lab results and medications reviewed.      Trenton GammonJessica Oaklee Sunga, RD, LDN Inpatient Clinical Dietitian Pager # [phone] After hours/weekend pager # [phone]

## 2015-01-27 NOTE — Progress Notes (Signed)
DAR NOTE: Patient mood and affect remained depressed. Denies pain, auditory and visual hallucinations.  Rates depression at 6, hopelessness at 6, and anxiety at 6.  Maintained on routine safety checks.  Medications given as prescribed.  Support and encouragement offered as needed.  Attended group and participated.  States goal for today is "stay focus."  Patient observed socializing with peers in the dayroom.  Patient is isolative to his room.
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
## 2015-01-27 NOTE — [facility] Group Notes (Signed)
[facility] LCSW Group Therapy  01/27/2015  1:05 PM  Type of Therapy:  Group therapy  Participation Level:  Active  Participation Quality:  Attentive  Affect:  Flat  Cognitive:  Oriented  Insight:  Limited  Engagement in Therapy:  Limited  Modes of Intervention:  Discussion, Socialization  Summary of Progress/Problems:  Chaplain was here to lead a group on themes of hope and courage.  "I've been in some really dark places, and God's mercy is the thing that has pulled me through.  I am experiencing it now.  I guess coming to the hospital is a way of finding it."  [name], Madlynn Lundeen B 01/27/2015 1:27 PM

## 2015-01-27 NOTE — [facility] Suicide Risk Assessment (Signed)
[facility] INPATIENT:  Family/Significant Other Suicide Prevention Education  Suicide Prevention Education:  Patient Refusal for Family/Significant Other Suicide Prevention Education: The patient Zachary Hull has refused to provide written consent for family/significant other to be provided Family/Significant Other Suicide Prevention Education during admission and/or prior to discharge.  Physician notified.  [name], Kilah Drahos B 01/27/2015, 4:11 PM

## 2015-01-27 NOTE — Progress Notes (Signed)
Adult Psychoeducational Group Note  Date:  01/27/2015 Time:  8:30 PM  Group Topic/Focus:  Wrap-Up Group:   The focus of this group is to help patients review their daily goal of treatment and discuss progress on daily workbooks.  Participation Level:  Did Not Attend  Pt appeared to be asleep during wrap-up group.    Cleotilde NeerJasmine S Schnick 01/27/2015, 8:44 PM
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
## 2015-01-28 NOTE — Progress Notes (Signed)
Psychoeducational Group Note  Date:  01/28/2015 Time:  2306  Group Topic/Focus:  Wrap-Up Group:   The focus of this group is to help patients review their daily goal of treatment and discuss progress on daily workbooks.  Participation Level: Did Not Attend  Participation Quality:  Not Applicable  Affect:  Not Applicable  Cognitive:  Not Applicable  Insight:  Not Applicable  Engagement in Group: Not Applicable  Additional Comments:  The patient did not attend group this evening since he was asleep.   Joretta Eads S 01/28/2015, 11:05 PM

## 2015-01-28 NOTE — [facility] Group Notes (Signed)
[facility] Group Notes: (Clinical Social Work)   01/28/2015      Type of Therapy:  Group Therapy   Participation Level:  Did Not Attend despite MHT prompting   Nayab Aten Grossman-Orr, LCSW 01/28/2015, 4:19 PM     

## 2015-01-28 NOTE — Progress Notes (Signed)
DAR NOTE: Patient presents with anxious affect and depressed mood.  Denies pain, auditory and visual hallucinations.  Rates depression at 5, hopelessness at 8, and anxiety at 5.  Maintained on routine safety checks.  Medications given as prescribed.  Support and encouragement offered as needed.  States goal for today is "getting my life back together."  Patient remained isolative to his room.  Minimal interaction with staff.

## 2015-01-28 NOTE — [facility] Group Notes (Signed)
[facility] Group Notes:  (Nursing/MHT/Case Management/Adjunct)  Date:  01/28/2015  Time:  12:32 PM  Type of Therapy:  Nurse Education  Participation Level:  Did Not Attend  Mickie Baillizabeth O Iwenekha 01/28/2015, 12:32 PM

## 2015-01-28 NOTE — Tx Team (Signed)
Initial Interdisciplinary Treatment Plan   PATIENT STRESSORS: Financial difficulties Loss of mother in 2011; still grieving Marital or family conflict Substance abuse   PATIENT STRENGTHS: Average or above average intelligence Capable of independent living General fund of knowledge Supportive family/friends   PROBLEM LIST: Problem List/Patient Goals Date to be addressed Date deferred Reason deferred Estimated date of resolution  Depression      Substance abuse: cocaine, ETOH, THC      Risk for self harm      Grief/loss(mother)            "I would like help getting into rehab for my drug use:"      "I need some stronger meds to help with the voices"                   DISCHARGE CRITERIA:  Ability to meet basic life and health needs Improved stabilization in mood, thinking, and/or behavior Motivation to continue treatment in a less acute level of care Need for constant or close observation no longer present Verbal commitment to aftercare and medication compliance Withdrawal symptoms are absent or subacute and managed without 24-hour nursing intervention  PRELIMINARY DISCHARGE PLAN: Attend aftercare/continuing care group Attend 12-step recovery group Outpatient therapy Return to previous living arrangement  PATIENT/FAMIILY INVOLVEMENT: This treatment plan has been presented to and reviewed with the patient, Zachary Hull, and/or family member.  The patient and family have been given the opportunity to ask questions and make suggestions.  Jesus GeneraSpeagle, [place] Daywalt [facility] 01/28/2015, 3:48 AM

## 2015-01-28 NOTE — Progress Notes (Signed)
Pt has been in the dayroom most of the evening, but has stayed to himself with little to no interaction with his peers.  He says his day has been fine, and he denies SI/HI/AVH.  He voiced no needs or concerns to Clinical research associatewriter.  His discharge plans are in process, but he tells this Clinical research associatewriter that he expects to go home tomorrow(Saturday).  He feels that the medications are working for him and intends to continue taking them.  His behavior has been appropriate with Clinical research associatewriter, although MHT reported that pt seemed irritable during group time.  Support and encouragement offered.  Pt encouraged to make his needs known.  Safety maintained with q15 minute checks.

## 2015-01-29 MED ORDER — FLUPHENAZINE HCL 5 MG PO TABS: 5.0000 mg | ORAL_TABLET | Freq: Every day | ORAL | Status: DC

## 2015-01-29 MED ORDER — BENZTROPINE MESYLATE 0.5 MG PO TABS: 0.5000 mg | ORAL_TABLET | Freq: Every day | ORAL | Status: DC

## 2015-01-29 MED ORDER — FLUOXETINE HCL 20 MG PO CAPS: 20.0000 mg | ORAL_CAPSULE | Freq: Every day | ORAL | Status: DC

## 2015-01-29 MED ORDER — HYDROXYZINE HCL 25 MG PO TABS: 25.0000 mg | ORAL_TABLET | Freq: Four times a day (QID) | ORAL | Status: DC | PRN

## 2015-01-29 MED ORDER — MIRTAZAPINE 7.5 MG PO TABS: 7.5000 mg | ORAL_TABLET | Freq: Every day | ORAL | Status: DC

## 2015-01-29 MED ORDER — NICOTINE 21 MG/24HR TD PT24: 21.0000 mg | MEDICATED_PATCH | Freq: Every day | TRANSDERMAL | Status: DC
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
## 2015-01-29 NOTE — Progress Notes (Signed)
  [facility] Discharge Plan :  Will you be returning to the same living situation after discharge:  Yes,  alone At discharge, do you have transportation home?: Yes,  has a ride Do you have the ability to pay for your medications: Yes,  states there are no issues  Release of information consent forms completed and in the chart;  Patient's signature needed at discharge.  Patient to Follow up at: Follow-up Information    Follow up with [facility].   Why:  Follow up with them per ususal for med managment upon d/c   Contact information:   [address]  [place] [phone]      Follow up with [facility].   Why:  Call them daily starting Tuesday AM to find out about bed availability.  I FAXED them all needed paperwork while you were here.   Contact information:   [place]  [phone]      Next level of care provider [facility] Link:no  Safety Planning and Suicide Prevention discussed: Yes,  with patient as he refused with others  Have you used any form of tobacco in the last 30 days? (Cigarettes, Smokeless Tobacco, Cigars, and/or Pipes): Yes  Has patient been referred to the Quitline?: Patient refused referral  Patient has been referred for addiction treatment: Yes  Sarina SerGrossman-Orr, Laelyn Blumenthal Jo 01/29/2015, 1:14 PM

## 2015-01-29 NOTE — [facility] Suicide Risk Assessment (Signed)
[facility] Discharge Suicide Risk Assessment   Demographic Factors:  Adolescent or young adult, Low socioeconomic status, Living alone and Unemployed  Total Time spent with patient: 30 minutes  Musculoskeletal: Strength & Muscle Tone: within normal limits Gait & Station: normal Patient leans: N/A  Psychiatric Specialty Exam: Physical Exam  ROS  Blood pressure 122/97, pulse 82, temperature 97.9 F (36.6 C), temperature source Oral, resp. rate 18, height 5\' 6"  (1.676 m), weight 76.658 kg (169 lb).Body mass index is 27.29 kg/(m^2).  General Appearance: Casual  Eye Contact::  Good  Speech:  Clear and Coherent409  Volume:  Normal  Mood:  Euthymic  Affect:  Appropriate and Congruent  Thought Process:  Coherent and Goal Directed  Orientation:  Full (Time, Place, and Person)  Thought Content:  WDL  Suicidal Thoughts:  No  Homicidal Thoughts:  No  Memory:  Immediate;   Good Recent;   Fair Remote;   Fair  Judgement:  Intact  Insight:  Good  Psychomotor Activity:  Normal  Concentration:  Good  Recall:  Good  Fund of Knowledge:Good  Language: Good  Akathisia:  Negative  Handed:  Right  AIMS (if indicated):     Assets:  Communication Skills Desire for Improvement Leisure Time Physical Health Resilience Social Support  Sleep:  Number of Hours: 6.25  Cognition: WNL  ADL's:  Intact   Have you used any form of tobacco in the last 30 days? (Cigarettes, Smokeless Tobacco, Cigars, and/or Pipes): Yes  Has this patient used any form of tobacco in the last 30 days? (Cigarettes, Smokeless Tobacco, Cigars, and/or Pipes) Yes, A prescription for an FDA-approved tobacco cessation medication was offered at discharge and the patient refused  Mental Status Per Nursing Assessment::   On Admission:  NA  Current Mental Status by Physician: NA  Loss Factors: Financial problems/change in socioeconomic status  Historical Factors: NA  Risk Reduction Factors:   Religious beliefs about death,  Positive social support, Positive therapeutic relationship and Positive coping skills or problem solving skills  Continued Clinical Symptoms:  Alcohol/Substance Abuse/Dependencies Schizophrenia:   Paranoid or undifferentiated type  Cognitive Features That Contribute To Risk:  None    Suicide Risk:  Minimal: No identifiable suicidal ideation.  Patients presenting with no risk factors but with morbid ruminations; may be classified as minimal risk based on the severity of the depressive symptoms  Principal Problem: Schizophrenia, undifferentiated (HCC) Discharge Diagnoses:  Patient Active Problem List   Diagnosis Date Noted  . Cocaine use disorder, severe, dependence (HCC) [F14.20] 01/26/2015  . Schizophrenia, undifferentiated (HCC) [F20.3] 01/25/2015  . Cannabis use disorder, moderate, dependence (HCC) [F12.20] 12/19/2014  . Alcohol use disorder, severe, dependence (HCC) [F10.20] 12/16/2014    Follow-up Information    Follow up with [facility].   Why:  Follow up with them per ususal for med managment upon d/c   Contact information:   [address]  [place] [phone]      Follow up with [facility].   Why:  Call them daily starting Tuesday AM to find out about bed availability.  I FAXED them all needed paperwork while you were here.   Contact information:   [place]  [phone]      Plan Of Care/Follow-up recommendations:  Activity:  As tolerated Diet:  Regular  Is patient on multiple antipsychotic therapies at discharge:  No   Has Patient had three or more failed trials of antipsychotic monotherapy by history:  No  Recommended Plan for Multiple  Antipsychotic Therapies: NA    Marjie Chea,JANARDHAHA R. 01/29/2015, 10:57 AM

## 2015-01-29 NOTE — ED Notes (Signed)
Chart access to locate belongings

## 2015-01-29 NOTE — Progress Notes (Signed)
D: Pt is flat, isolative and withdrawn to room; Pt was practically in bed resting with eyes close. Pt endorses moderate depression; "I will be leaving tomorrow; I just don't feel good." Pt denied SI/HI pain, AVH and pain. A: Medications offered as prescribed.  Support, encouragement, and safe environment provided.  15-minute safety checks continue. R: Pt was med compliant.  Pt did not attend wrap-up group. Safety checks continue.

## 2015-01-29 NOTE — [facility] Group Notes (Signed)
[facility] Group Notes: (Clinical Social Work)   01/29/2015      Type of Therapy:  Group Therapy   Participation Level:  Did Not Attend despite MHT prompting - he did arrive toward the end of group, participated and was encouraging toward another new patient who was requesting a certain type of music, as well as how happy that music made the individual.   Ambrose MantleMareida Grossman-Orr, LCSW 01/29/2015, 1:02 PM

## 2015-01-29 NOTE — Progress Notes (Signed)
Victorio hasMaisie Hull been in his room most of the morning.  He didn't get up to take his medications at the regular time.  He had to be reminded multiple times to come up for medications.  He denies any SI/HI or A/V hallucinations.  He didn't complete his self inventory after much encouragement.  He didn't attend AM group today.  He denies any pain or discomfort and appears to be in no physical distress.  Pt. D/C from the unit accompanied by family.  D/C follow up paperwork reviewed with pt and copy sent as well as prescriptions.  Belongings (from locker 32, cell phone, belt, hat, phone charger, white coat, white shoes and blue sweater/shirt ) returned to pt. Pt was upset that he didn't have his blue jeans that he states he came in with.  No jeans were on belonging sheets.  He became upset so AC was called to help resolve the situation.  He was instructed to go to [facility] ED to inquire about belongings that he came into triage wearing.  He was upset but stated that he will go to ED and check with front dest.  Q 15 min checks maintained until discharge.  He left the unit in no apparent distress.

## 2015-02-02 ENCOUNTER — Emergency Department (HOSPITAL_COMMUNITY)
Admission: EM | Admit: 2015-02-02 | Discharge: 2015-02-02 | Disposition: A | Discharge status: Home / Self Care | Payer: Medicaid Other | Attending: Emergency Medicine | Admitting: Emergency Medicine

## 2015-02-02 ENCOUNTER — Encounter (HOSPITAL_COMMUNITY): Payer: Self-pay

## 2015-02-02 DIAGNOSIS — F1410 Cocaine abuse, uncomplicated: Secondary | ICD-10-CM | POA: Insufficient documentation

## 2015-02-02 DIAGNOSIS — Z8701 Personal history of pneumonia (recurrent): Secondary | ICD-10-CM | POA: Insufficient documentation

## 2015-02-02 DIAGNOSIS — F419 Anxiety disorder, unspecified: Secondary | ICD-10-CM | POA: Diagnosis not present

## 2015-02-02 DIAGNOSIS — Z79899 Other long term (current) drug therapy: Secondary | ICD-10-CM | POA: Insufficient documentation

## 2015-02-02 DIAGNOSIS — F1010 Alcohol abuse, uncomplicated: Secondary | ICD-10-CM | POA: Insufficient documentation

## 2015-02-02 DIAGNOSIS — F329 Major depressive disorder, single episode, unspecified: Secondary | ICD-10-CM | POA: Insufficient documentation

## 2015-02-02 DIAGNOSIS — F209 Schizophrenia, unspecified: Secondary | ICD-10-CM | POA: Diagnosis not present

## 2015-02-02 DIAGNOSIS — F17210 Nicotine dependence, cigarettes, uncomplicated: Secondary | ICD-10-CM | POA: Diagnosis not present

## 2015-02-02 DIAGNOSIS — R441 Visual hallucinations: Secondary | ICD-10-CM | POA: Diagnosis present

## 2015-02-02 LAB — COMPREHENSIVE METABOLIC PANEL
ALK PHOS: 66 U/L (ref 38–126)
ALT: 28 U/L (ref 17–63)
AST: 27 U/L (ref 15–41)
Albumin: 4 g/dL (ref 3.5–5.0)
Anion gap: 9 (ref 5–15)
BUN: 18 mg/dL (ref 6–20)
CALCIUM: 9.6 mg/dL (ref 8.9–10.3)
CO2: 26 mmol/L (ref 22–32)
CREATININE: 1.15 mg/dL (ref 0.61–1.24)
Chloride: 105 mmol/L (ref 101–111)
GFR calc non Af Amer: >60 mL/min (ref >60)
Glucose, Bld: 93 mg/dL (ref 65–99)
Potassium: 3.6 mmol/L (ref 3.5–5.1)
SODIUM: 140 mmol/L (ref 135–145)
Total Bilirubin: 1 mg/dL (ref 0.3–1.2)
Total Protein: 6.5 g/dL (ref 6.5–8.1)

## 2015-02-02 LAB — CBC
HCT: 45.9 % (ref 39.0–52.0)
Hemoglobin: 15.5 g/dL (ref 13.0–17.0)
MCH: 32.2 pg (ref 26.0–34.0)
MCHC: 33.8 g/dL (ref 30.0–36.0)
MCV: 95.4 fL (ref 78.0–100.0)
PLATELETS: 252 10*3/uL (ref 150–400)
RBC: 4.81 MIL/uL (ref 4.22–5.81)
RDW: 13.6 % (ref 11.5–15.5)
WBC: 8.3 10*3/uL (ref 4.0–10.5)

## 2015-02-02 LAB — ETHANOL: Alcohol, Ethyl (B): <5 mg/dL (ref <5)

## 2015-02-02 LAB — RAPID URINE DRUG SCREEN, HOSP PERFORMED
Amphetamines: NOT DETECTED
Barbiturates: NOT DETECTED
Benzodiazepines: POSITIVE — AB
Cocaine: POSITIVE — AB
Opiates: NOT DETECTED
Tetrahydrocannabinol: NOT DETECTED
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
## 2015-02-02 NOTE — ED Notes (Addendum)
Per [facility], pt here for detox from ETOH and cocaine. Also reports seeing his dead mother and grandmother. His mom passed away on christmas and started seeing her back in November. The last time he used and drank was 1900 last night. Pt denies any SI or HI at this time. Pt hasn't had his meds in 2 months.

## 2015-02-02 NOTE — ED Notes (Signed)
Pt not happy about being discharged, refused instructions, refused to sign, a large puddle smelling of urine was found in corner of room -- pt states that it was already there-- room had been cleaned, puddle smelled like fresh urine- pt denied urinating on the wall.
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
## 2015-02-02 NOTE — Discharge Instructions (Signed)
°Emergency Department Resource Guide °1) Find a Doctor and Pay Out of Pocket °Although you won't have to find out who is covered by your insurance plan, it is a good idea to ask around and get recommendations. You will then need to call the office and see if the doctor you have chosen will accept you as a new patient and what types of options they offer for patients who are self-pay. Some doctors offer discounts or will set up payment plans for their patients who do not have insurance, but you will need to ask so you aren't surprised when you get to your appointment. ° °2) Contact Your Local Health Department °Not all health departments have doctors that can see patients for sick visits, but many do, so it is worth a call to see if yours does. If you don't know where your local health department is, you can check in your phone book. The CDC also has a tool to help you locate your state's health department, and many state websites also have listings of all of their local health departments. ° °3) Find a Walk-in Clinic °If your illness is not likely to be very severe or complicated, you may want to try a walk in clinic. These are popping up all over the country in pharmacies, drugstores, and shopping centers. They're usually staffed by nurse practitioners or physician assistants that have been trained to treat common illnesses and complaints. They're usually fairly quick and inexpensive. However, if you have serious medical issues or chronic medical problems, these are probably not your best option. ° °No Primary Care Doctor: °- Call [facility] at  832-8000 - they can help you locate a primary care doctor that  accepts your insurance, provides certain services, etc. °- Physician Referral Service- 1-800-533-3463 ° °Chronic Pain Problems: °Organization         Address  Phone   Notes  °[facility]  (336) 297-2271 Patients need to be referred by their primary care doctor.  ° °Medication  Assistance: °Organization         Address  Phone   Notes  °[place] [facility] [address] °[place], [place] [address] (336) 641-8030 --Must be a resident of [place] °-- Must have NO insurance coverage whatsoever (no Medicaid/ Medicare, etc.) °-- The pt. MUST have a primary care doctor that directs their care regularly and follows them in the community °  °[facility]  (866) 331-1348   °[employer]  (888) 892-1162   ° °Agencies that provide inexpensive medical care: °Organization         Address  Phone   Notes  °[facility]  (336) 832-8035   °[facility]    (336) 832-7272   °[facility] [address] °[place], [place] [address] (336) 832-4777   °[facility] [address], °[place] (336) 271-4999   °[facility]    (336) 373-0678   °[facility]    (336) 272-1050   °[facility] ° [address], [place] Phone:  (336) 832-4444, Fax:  (336) 832-4440 Hours of Operation:  9 am - 6 pm, M-F.  Also accepts Medicaid/Medicare and self-pay.  °[facility] ° [address], [place] Phone: (336) 832-3150, Fax: (336) 832-3151. Hours of Operation:  8:30 am - 5:30 pm, M-F.  Also accepts Medicaid and self-pay.  °[facility] [place] [address], [place] Phone: (336) 878-6027   °[facility] [address], [place], [place] (336)723-1848, Ext. 123 Mondays & Thursdays: 7-9 AM.  First 15 patients are seen on a first come, first serve basis. °  ° °Medicaid-accepting [place] Providers: ° °Organization         Address  Phone   Notes  °[facility] [address], [place] (336) 641-2100 Also accepts self-pay patients.  °[facility] [address], [place] ° (336) 856-9996   °[facility] [address], [place]  (336) 288-8857   °[facility] [address] [place] [address], [place] (336) 299-7000   °Veita Bland [address], [place]  ° (336) 373-1557 Only accepts [place] Access Medicaid patients after they have their name applied to their card.  ° °Self-Pay (no insurance) in [place]: ° °Organization         Address  Phone   Notes  °Sickle Cell Patients, [facility] [address], [place] (336) 832-1970   °[facility] [address], [place] (336) 832-4400   °[facility] ° [address], [place] (336) 992-4800   °[facility]/Dr. Osei-Bonsu ° [address] [place] [address], [place] or [address], [place] (336) 841-8500 Phone number for both [place] and [place] locations is the same.  °[facility] [address], [place] (336) 299-0000   °[facility] [address] [place] [address], [place] or [address] (336) 852-7530 °(336) 878-2260   °[facility] [address], [place] (336) 350-1642, phone; (336) 294-5005, fax Sees patients 1st and 3rd Saturday of every month.  Must not qualify for public or private insurance (i.e. Medicaid, Medicare, [place] Health Choice, Veterans' Benefits) • Household income should be no more than 200% of the poverty level •The clinic cannot treat you if you are pregnant or think you are pregnant • Sexually transmitted diseases are not treated at the clinic.  ° ° °Dental Care: °Organization         Address  Phone  Notes  °[place] [facility] [address], [place] (336) 641-6152 Accepts children up to age 21 who are enrolled in Medicaid or [place] Health Choice; pregnant women with a Medicaid card; and children who have applied for Medicaid or [place] Health Choice, but were declined, whose parents can pay a reduced fee at time of service.  °[place]  [facility] [place]  [address], [place] (336) 641-7733 Accepts children up to age 21 who are enrolled in Medicaid or [place] Health Choice; pregnant women with a Medicaid card; and children who have applied for Medicaid or [place] Health Choice, but were declined, whose parents can pay a reduced fee at time of service.  °[facility] ° [address], [place] (336) 641-4533 Patients are seen by appointment only. Walk-ins are not accepted. [facility] will see patients 18 years of age and older. °Monday - Tuesday (8am-5pm) °Most Wednesdays (8:30-5pm) °$30 per visit, cash only  °[facility] ° [address], [place] (336) 641-4533 Patients are seen by appointment only. Walk-ins are not accepted. [facility] will see patients 18 years of age and older. °One   Wednesday Evening (Monthly: Volunteer Based).  $30 per visit, cash only  °[facility]  (919) 537-3737 for adults; Children under age 4, call Graduate Pediatric Dentistry at (919) [phone]. Children aged 4-14, please call (919) [phone] to request a pediatric application. ° Dental services are provided in all areas of dental care including fillings, crowns and bridges, complete and partial dentures, implants, gum treatment, root canals, and extractions. Preventive care is also provided. Treatment is provided to both adults and children. °Patients are selected via a lottery and there is often a waiting list. °  °[facility] [address], °[place] ° (336) 763-8833 [email or web address] °  °[facility] [address], [place], [place] (336)723-1848, Ext. 123 Second and Fourth Thursday of each month, opens at 6:30 AM; Clinic ends at 9 AM.  Patients are seen on a first-come first-served basis, and a limited number are seen during each clinic.  ° °[facility] ° [address], [place], [place] (336) 723-7904    Eligibility Requirements °You must have lived in [place], [place], or [place] counties for at least the last three months. °  You cannot be eligible for state or federal sponsored healthcare insurance, including Veterans Administration, Medicaid, or Medicare. °  You generally cannot be eligible for healthcare insurance through your employer.  °  How to apply: °Eligibility screenings are held every Tuesday and Wednesday afternoon from 1:00 pm until 4:00 pm. You do not need an appointment for the interview!  °[facility] [address], [place], [place] 336-631-2330   °[place] [facility]  336-342-8273   °[place] [facility]  336-703-3100   °[facility]  336-570-6415   ° °Behavioral Health Resources in the Community: °Intensive Outpatient Programs °Organization         Address  Phone  Notes  °[facility] [address], [place], [place] 336-878-6098   °[facility] [address], [place], [place] 336-832-9800   °[facility] [address], [place], [place] ° 336-882-2125   °[facility] [address],  °[place], [place] 1-800-853-5163 or 336-641-4981   °Substance Abuse Resources °Organization         Address  Phone  Notes  °[facility]  336-882-2125   °[facility]  336-784-9470   °[facility]  336-285-9073   °[facility]  336-845-3988   °Residential & Outpatient Substance Abuse Program  1-800-659-3381   °Psychological Services °Organization         Address  Phone  Notes  °[facility]  336- 832-9600   °[facility]  336- 378-7881   °[facility] [address], [place] 1-800-853-5163 or 336-641-4981   ° °Mobile Crisis Teams °Organization         Address  Phone  Notes  °[facility], Mobile Crisis Care Unit  1-877-626-1772   °Assertive °[facility] ° [address]  [place], [place] 336-834-9664   °Sharon DeEsch [address] °[place]  336-554-5454   ° °Self-Help/Support Groups °Organization         Address  Phone             Notes  °[facility] - variety of support groups  336- 373-1402 Call for more information  °Narcotics Anonymous (NA), [facility] [address], °[place]   2 meetings at this location  ° °  Residential Treatment Programs °Organization         Address  Phone  Notes  °[facility] [address],    °[place]  1-866-801-8205   °[facility] ° [address], [place], [place] 704-293-8524   °[facility] [address], [place] 336-845-3988 Admissions: 8am-3pm M-F  °[facility] [address]    °[place], [place] 336-841-1104   °[facility] [address], [place], [place] 336-379-7146   °[facility] [address]  °[place], [place] 336-285-9073   °[facility] Intensive Outpatient [address], [place], [place] 336-852-3033   °[facility] [address]  °[place], [place] 1-877-615-2722 or 336-784-9470   °[facility] [address] [place], [place] 336-227-7417 Accepts Medicaid  °[facility] [address]  °[place] 1-800-659-3381 Substance Abuse/Addiction Treatment  ° °[place] Behavioral Health Resources °Organization         Address  Phone  Notes  °[facility]  (888) 581-9988   °Julie Brannon, PhD [address] [place], [place]   (336) 349-5553 or (336) 951-0000   °[facility]   [address] °[place], [place] (336) 349-4454   °[facility] [address], [place], [place] (336) 342-8316 Insurance/Medicaid/sponsorship through [facility]  °[facility] [address]                                    [place], [place] (336) 342-8316 Therapy/tele-psych/case    °[facility] [address]  ° [place],  (336) 349-2233    °Dr. Arfeen  (336) 349-4544   °[facility] [place]  [employer] [place] [facility] 1) [address], [place] °2) [address], [place] °3)  [address], [place] (336) 349-3220 °(336) 342-7768 ° °(336) 342-8140   °[place] [facility] (336) 342-1394 or (336) 342-3537 (After Hours)    ° ° °

## 2015-03-22 ENCOUNTER — Emergency Department (HOSPITAL_COMMUNITY)
Admission: EM | Admit: 2015-03-22 | Discharge: 2015-03-22 | Disposition: A | Discharge status: Other Acute Inpatient Hospital | Payer: Medicaid Other | Attending: Emergency Medicine | Admitting: Emergency Medicine

## 2015-03-22 ENCOUNTER — Encounter (HOSPITAL_COMMUNITY): Payer: Self-pay

## 2015-03-22 ENCOUNTER — Inpatient Hospital Stay (HOSPITAL_COMMUNITY)
Admission: AD | Admit: 2015-03-22 | Discharge: 2015-03-29 | DRG: 885 | Disposition: A | Discharge status: Home / Self Care | Payer: Medicaid Other | Source: Intra-hospital | Attending: Psychiatry | Admitting: Psychiatry

## 2015-03-22 DIAGNOSIS — Z818 Family history of other mental and behavioral disorders: Secondary | ICD-10-CM

## 2015-03-22 DIAGNOSIS — G4700 Insomnia, unspecified: Secondary | ICD-10-CM | POA: Diagnosis present

## 2015-03-22 DIAGNOSIS — F1210 Cannabis abuse, uncomplicated: Secondary | ICD-10-CM | POA: Diagnosis not present

## 2015-03-22 DIAGNOSIS — Z9114 Patient's other noncompliance with medication regimen: Secondary | ICD-10-CM

## 2015-03-22 DIAGNOSIS — F323 Major depressive disorder, single episode, severe with psychotic features: Secondary | ICD-10-CM | POA: Diagnosis present

## 2015-03-22 DIAGNOSIS — F203 Undifferentiated schizophrenia: Principal | ICD-10-CM | POA: Diagnosis present

## 2015-03-22 DIAGNOSIS — F1410 Cocaine abuse, uncomplicated: Secondary | ICD-10-CM | POA: Diagnosis not present

## 2015-03-22 DIAGNOSIS — R440 Auditory hallucinations: Secondary | ICD-10-CM | POA: Diagnosis not present

## 2015-03-22 DIAGNOSIS — Z8249 Family history of ischemic heart disease and other diseases of the circulatory system: Secondary | ICD-10-CM | POA: Diagnosis not present

## 2015-03-22 DIAGNOSIS — F1420 Cocaine dependence, uncomplicated: Secondary | ICD-10-CM | POA: Diagnosis present

## 2015-03-22 DIAGNOSIS — R630 Anorexia: Secondary | ICD-10-CM | POA: Insufficient documentation

## 2015-03-22 DIAGNOSIS — F17210 Nicotine dependence, cigarettes, uncomplicated: Secondary | ICD-10-CM | POA: Diagnosis present

## 2015-03-22 DIAGNOSIS — F1910 Other psychoactive substance abuse, uncomplicated: Secondary | ICD-10-CM

## 2015-03-22 DIAGNOSIS — Z8701 Personal history of pneumonia (recurrent): Secondary | ICD-10-CM | POA: Insufficient documentation

## 2015-03-22 DIAGNOSIS — R45851 Suicidal ideations: Secondary | ICD-10-CM | POA: Diagnosis present

## 2015-03-22 DIAGNOSIS — F419 Anxiety disorder, unspecified: Secondary | ICD-10-CM | POA: Diagnosis present

## 2015-03-22 DIAGNOSIS — F1220 Cannabis dependence, uncomplicated: Secondary | ICD-10-CM | POA: Diagnosis present

## 2015-03-22 DIAGNOSIS — G478 Other sleep disorders: Secondary | ICD-10-CM | POA: Insufficient documentation

## 2015-03-22 DIAGNOSIS — F329 Major depressive disorder, single episode, unspecified: Secondary | ICD-10-CM | POA: Diagnosis not present

## 2015-03-22 DIAGNOSIS — F1020 Alcohol dependence, uncomplicated: Secondary | ICD-10-CM | POA: Diagnosis present

## 2015-03-22 LAB — COMPREHENSIVE METABOLIC PANEL
ALBUMIN: 5.1 g/dL — AB (ref 3.5–5.0)
ALT: 32 U/L (ref 17–63)
ANION GAP: 12 (ref 5–15)
AST: 28 U/L (ref 15–41)
Alkaline Phosphatase: 74 U/L (ref 38–126)
BUN: 13 mg/dL (ref 6–20)
CO2: 25 mmol/L (ref 22–32)
Calcium: 10 mg/dL (ref 8.9–10.3)
Chloride: 100 mmol/L — ABNORMAL LOW (ref 101–111)
Creatinine, Ser: 1.02 mg/dL (ref 0.61–1.24)
GFR calc non Af Amer: >60 mL/min (ref >60)
GLUCOSE: 84 mg/dL (ref 65–99)
POTASSIUM: 4 mmol/L (ref 3.5–5.1)
SODIUM: 137 mmol/L (ref 135–145)
TOTAL PROTEIN: 7.8 g/dL (ref 6.5–8.1)
Total Bilirubin: 1.4 mg/dL — ABNORMAL HIGH (ref 0.3–1.2)

## 2015-03-22 LAB — CBC
HEMATOCRIT: 48.4 % (ref 39.0–52.0)
Hemoglobin: 16.8 g/dL (ref 13.0–17.0)
MCH: 33.7 pg (ref 26.0–34.0)
MCHC: 34.7 g/dL (ref 30.0–36.0)
MCV: 97 fL (ref 78.0–100.0)
Platelets: 266 10*3/uL (ref 150–400)
RBC: 4.99 MIL/uL (ref 4.22–5.81)
RDW: 13.6 % (ref 11.5–15.5)
WBC: 7.7 10*3/uL (ref 4.0–10.5)

## 2015-03-22 LAB — RAPID URINE DRUG SCREEN, HOSP PERFORMED
Amphetamines: NOT DETECTED
BARBITURATES: NOT DETECTED
BENZODIAZEPINES: NOT DETECTED
COCAINE: POSITIVE — AB
OPIATES: NOT DETECTED
Tetrahydrocannabinol: POSITIVE — AB

## 2015-03-22 LAB — ETHANOL: Alcohol, Ethyl (B): <5 mg/dL (ref <5)

## 2015-03-22 LAB — SALICYLATE LEVEL

## 2015-03-22 LAB — ACETAMINOPHEN LEVEL

## 2015-03-22 MED ORDER — ACETAMINOPHEN 325 MG PO TABS: 650.0000 mg | ORAL_TABLET | ORAL | Status: DC | PRN

## 2015-03-22 MED ORDER — FLUPHENAZINE HCL 5 MG PO TABS
5.0000 mg | ORAL_TABLET | Freq: Every day | ORAL | Status: DC
  Administered 2015-03-23 (×2): 5 mg via ORAL
  Filled 2015-03-22 (×4): qty 1

## 2015-03-22 MED ORDER — HYDROXYZINE HCL 25 MG PO TABS: 25.0000 mg | ORAL_TABLET | Freq: Four times a day (QID) | ORAL | Status: DC | PRN

## 2015-03-22 MED ORDER — ACETAMINOPHEN 325 MG PO TABS
650.0000 mg | ORAL_TABLET | Freq: Four times a day (QID) | ORAL | Status: DC | PRN
  Filled 2015-03-22: qty 2

## 2015-03-22 MED ORDER — IBUPROFEN 200 MG PO TABS: 600.0000 mg | ORAL_TABLET | Freq: Three times a day (TID) | ORAL | Status: DC | PRN

## 2015-03-22 MED ORDER — ALUM & MAG HYDROXIDE-SIMETH 200-200-20 MG/5ML PO SUSP
30.0000 mL | ORAL | Status: DC | PRN
  Administered 2015-03-24: 30 mL via ORAL
  Filled 2015-03-22: qty 30

## 2015-03-22 MED ORDER — BENZTROPINE MESYLATE 0.5 MG PO TABS
0.5000 mg | ORAL_TABLET | Freq: Every day | ORAL | Status: DC
  Administered 2015-03-23 – 2015-03-24 (×3): 0.5 mg via ORAL
  Filled 2015-03-22 (×6): qty 1

## 2015-03-22 MED ORDER — FLUOXETINE HCL 20 MG PO CAPS
20.0000 mg | ORAL_CAPSULE | Freq: Every day | ORAL | Status: DC
  Administered 2015-03-23 – 2015-03-24 (×2): 20 mg via ORAL
  Filled 2015-03-22 (×3): qty 1

## 2015-03-22 MED ORDER — ALUM & MAG HYDROXIDE-SIMETH 200-200-20 MG/5ML PO SUSP
30.0000 mL | ORAL | Status: DC | PRN
  Filled 2015-03-22: qty 30

## 2015-03-22 MED ORDER — MIRTAZAPINE 15 MG PO TABS
15.0000 mg | ORAL_TABLET | Freq: Every day | ORAL | Status: DC
  Administered 2015-03-23 – 2015-03-28 (×7): 15 mg via ORAL
  Filled 2015-03-22 (×9): qty 1

## 2015-03-22 MED ORDER — MAGNESIUM HYDROXIDE 400 MG/5ML PO SUSP: 30.0000 mL | Freq: Every day | ORAL | Status: DC | PRN

## 2015-03-22 MED ORDER — ONDANSETRON HCL 4 MG PO TABS
4.0000 mg | ORAL_TABLET | Freq: Three times a day (TID) | ORAL | Status: DC | PRN
  Administered 2015-03-22: 4 mg via ORAL
  Filled 2015-03-22: qty 1

## 2015-03-22 MED ORDER — NICOTINE 21 MG/24HR TD PT24: 21.0000 mg | MEDICATED_PATCH | Freq: Every day | TRANSDERMAL | Status: DC | PRN

## 2015-03-22 MED ORDER — ZOLPIDEM TARTRATE 5 MG PO TABS: 5.0000 mg | ORAL_TABLET | Freq: Every evening | ORAL | Status: DC | PRN

## 2015-03-22 NOTE — ED Notes (Signed)
Pt changed into scrubs and wanded by security  

## 2015-03-22 NOTE — ED Notes (Signed)
C/O nausea.  Scheduled PRN,gingerale, and saltines given. Stated he had been "taking morphine".

## 2015-03-22 NOTE — ED Notes (Signed)
Pt c/o SI w/ plan to shoot himself or OD, auditory hallucinations, and increasing anxiety x 1 month.  Pt reports that he "misplaced" his medications "a month or two ago."  Pt reports he recently lost his father.  Sts he has been "drinking too much and not eating" x 1 month.  Sts voice are telling him "to jump on people and stupid stuff."  Last etoh drink yesterday (whiskey).  Pt reports using cocaine and morphine last night.

## 2015-03-22 NOTE — ED Notes (Signed)
Bed: [facility]39 Expected date:  Expected time:  Means of arrival:  Comments: Triage 4 

## 2015-03-22 NOTE — BH Assessment (Signed)
Per Claudette Head, DNP - patient meets criteria for inpatient hospitalization.  TTS will seek placement  Writer informed the [facility] Inetta Fermo).

## 2015-03-22 NOTE — ED Notes (Signed)
Pt transported to [facility] by [facility] transportation service for continuation of specialized care. Belongings given to driver after patient signed for them. Pt left in no acute distress. 
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
## 2015-03-22 NOTE — ED Notes (Signed)
Patient GF is Psychologist, educational. Ph number [phone].

## 2015-03-22 NOTE — ED Notes (Signed)
Patient admits to [facility] with a plan to shoot himself or over dose. Patient also admits to [facility] telling him to hurt himself and others. Plan of care discussed with patient. Patient voices no complaints or concerns at this time. Encouragement and support provided and safety maintain. Q 15 min safety checks remain in place.
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
## 2015-03-23 ENCOUNTER — Encounter (HOSPITAL_COMMUNITY): Payer: Self-pay | Admitting: Psychiatry

## 2015-03-23 DIAGNOSIS — F1420 Cocaine dependence, uncomplicated: Secondary | ICD-10-CM

## 2015-03-23 DIAGNOSIS — F1220 Cannabis dependence, uncomplicated: Secondary | ICD-10-CM

## 2015-03-23 DIAGNOSIS — F1020 Alcohol dependence, uncomplicated: Secondary | ICD-10-CM

## 2015-03-23 DIAGNOSIS — R45851 Suicidal ideations: Secondary | ICD-10-CM

## 2015-03-23 DIAGNOSIS — F203 Undifferentiated schizophrenia: Principal | ICD-10-CM

## 2015-03-23 MED ORDER — CHLORDIAZEPOXIDE HCL 25 MG PO CAPS
25.0000 mg | ORAL_CAPSULE | Freq: Every day | ORAL | Status: AC
  Administered 2015-03-27: 25 mg via ORAL
  Filled 2015-03-23: qty 1

## 2015-03-23 MED ORDER — CHLORDIAZEPOXIDE HCL 25 MG PO CAPS
25.0000 mg | ORAL_CAPSULE | Freq: Three times a day (TID) | ORAL | Status: AC
  Administered 2015-03-24 – 2015-03-25 (×3): 25 mg via ORAL
  Filled 2015-03-23 (×3): qty 1

## 2015-03-23 MED ORDER — ONDANSETRON 4 MG PO TBDP: 4.0000 mg | ORAL_TABLET | Freq: Four times a day (QID) | ORAL | Status: AC | PRN

## 2015-03-23 MED ORDER — LOPERAMIDE HCL 2 MG PO CAPS: 2.0000 mg | ORAL_CAPSULE | ORAL | Status: AC | PRN

## 2015-03-23 MED ORDER — CHLORDIAZEPOXIDE HCL 25 MG PO CAPS
25.0000 mg | ORAL_CAPSULE | Freq: Four times a day (QID) | ORAL | Status: AC
  Administered 2015-03-23 – 2015-03-24 (×4): 25 mg via ORAL
  Filled 2015-03-23 (×3): qty 1

## 2015-03-23 MED ORDER — CHLORDIAZEPOXIDE HCL 25 MG PO CAPS
25.0000 mg | ORAL_CAPSULE | ORAL | Status: AC
  Administered 2015-03-25 – 2015-03-26 (×2): 25 mg via ORAL
  Filled 2015-03-23 (×2): qty 1

## 2015-03-23 MED ORDER — NICOTINE 21 MG/24HR TD PT24
21.0000 mg | MEDICATED_PATCH | Freq: Every day | TRANSDERMAL | Status: DC
  Administered 2015-03-23 – 2015-03-29 (×7): 21 mg via TRANSDERMAL
  Filled 2015-03-23 (×9): qty 1

## 2015-03-23 MED ORDER — VITAMIN B-1 100 MG PO TABS
100.0000 mg | ORAL_TABLET | Freq: Every day | ORAL | Status: DC
  Administered 2015-03-24 – 2015-03-29 (×6): 100 mg via ORAL
  Filled 2015-03-23 (×8): qty 1

## 2015-03-23 MED ORDER — HYDROXYZINE HCL 25 MG PO TABS: 25.0000 mg | ORAL_TABLET | Freq: Four times a day (QID) | ORAL | Status: AC | PRN

## 2015-03-23 MED ORDER — THIAMINE HCL 100 MG/ML IJ SOLN
100.0000 mg | Freq: Once | INTRAMUSCULAR | Status: AC
  Administered 2015-03-23: 100 mg via INTRAMUSCULAR
  Filled 2015-03-23: qty 2

## 2015-03-23 MED ORDER — OLANZAPINE 5 MG PO TBDP
5.0000 mg | ORAL_TABLET | Freq: Three times a day (TID) | ORAL | Status: DC | PRN
  Administered 2015-03-25 – 2015-03-27 (×2): 5 mg via ORAL
  Filled 2015-03-23 (×2): qty 1

## 2015-03-23 MED ORDER — CHLORDIAZEPOXIDE HCL 25 MG PO CAPS: 25.0000 mg | ORAL_CAPSULE | Freq: Four times a day (QID) | ORAL | Status: AC | PRN

## 2015-03-23 MED ORDER — ADULT MULTIVITAMIN W/MINERALS CH
1.0000 | ORAL_TABLET | Freq: Every day | ORAL | Status: DC
  Administered 2015-03-23 – 2015-03-29 (×7): 1 via ORAL
  Filled 2015-03-23 (×9): qty 1

## 2015-03-23 NOTE — [facility] Suicide Risk Assessment (Signed)
[facility] Admission Suicide Risk Assessment   Nursing information obtained from:  Patient Demographic factors:  Male, Unemployed Current Mental Status:  Suicidal ideation indicated by patient Loss Factors:  Loss of significant relationship Historical Factors:  Prior suicide attempts Risk Reduction Factors:  Living with another person, especially a relative  Total Time spent with patient: 30 minutes Principal Problem: Schizophrenia, undifferentiated (HCC) Diagnosis:   Patient Active Problem List   Diagnosis Date Noted  . Cocaine use disorder, severe, dependence (HCC) [F14.20] 01/26/2015  . Schizophrenia, undifferentiated (HCC) [F20.3] 01/25/2015  . Cannabis use disorder, moderate, dependence (HCC) [F12.20] 12/19/2014  . Alcohol use disorder, severe, dependence (HCC) [F10.20] 12/16/2014   Subjective Data: Please see H&P.   Continued Clinical Symptoms:  Alcohol Use Disorder Identification Test Final Score (AUDIT): 31 The "Alcohol Use Disorders Identification Test", Guidelines for Use in Primary Care, Second Edition.  World Science writer [facility]). Score between 0-7:  no or low risk or alcohol related problems. Score between 8-15:  moderate risk of alcohol related problems. Score between 16-19:  high risk of alcohol related problems. Score 20 or above:  warrants further diagnostic evaluation for alcohol dependence and treatment.   CLINICAL FACTORS:   Alcohol/Substance Abuse/Dependencies Schizophrenia:   Depressive state Previous Psychiatric Diagnoses and Treatments Medical Diagnoses and Treatments/Surgeries   Musculoskeletal: Strength & Muscle Tone: within normal limits Gait & Station: normal Patient leans: N/A  Psychiatric Specialty Exam: Review of Systems  Psychiatric/Behavioral: Positive for depression, suicidal ideas, hallucinations and substance abuse. The patient is nervous/anxious and has insomnia.   All other systems reviewed and are negative.   Blood pressure 120/85,  pulse 84, temperature 98.8 F (37.1 C), temperature source Oral, resp. rate 18, height  (1.6 m), weight 73.483 kg (162 lb).Body mass index is 28.7 kg/(m^2).  Please see H&P.                                                       COGNITIVE FEATURES THAT CONTRIBUTE TO RISK:  Closed-mindedness, Polarized thinking and Thought constriction (tunnel vision)    SUICIDE RISK:   Moderate:  Frequent suicidal ideation with limited intensity, and duration, some specificity in terms of plans, no associated intent, good self-control, limited dysphoria/symptomatology, some risk factors present, and identifiable protective factors, including available and accessible social support.  PLAN OF CARE: Please see H&P.   I certify that inpatient services furnished can reasonably be expected to improve the patient's condition.   Paloma Grange, MD 03/23/2015, 11:58 AM
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
## 2015-03-23 NOTE — Tx Team (Signed)
Initial Interdisciplinary Treatment Plan   PATIENT STRESSORS: Financial difficulties Loss of dad in January 2017 Medication change or noncompliance Substance abuse   PATIENT STRENGTHS: Capable of independent living Communication skills Physical Health   PROBLEM LIST: Problem List/Patient Goals Date to be addressed Date deferred Reason deferred Estimated date of resolution  "I need help getting into a long-term facility" 03/22/2015     "Get my medication right" 03/22/2015     Psychosis 03/22/2015     Depression 03/22/2015     Risk for suicide 03/22/2015                              DISCHARGE CRITERIA:  Ability to meet basic life and health needs Motivation to continue treatment in a less acute level of care Safe-care adequate arrangements made Verbal commitment to aftercare and medication compliance  PRELIMINARY DISCHARGE PLAN: Attend aftercare/continuing care group Return to previous living arrangement  PATIENT/FAMIILY INVOLVEMENT: This treatment plan has been presented to and reviewed with the patient, Zachary Hull, and/or family member.  The patient and family have been given the opportunity to ask questions and make suggestions.  Kathi Dohn T Shatera Rennert 03/23/2015, 12:51 AM

## 2015-03-23 NOTE — Tx Team (Signed)
Interdisciplinary Treatment Plan Update (Adult) Date: 03/23/2015   Date: 03/23/2015 11:30 AM  Progress in Treatment:  Attending groups: Pt is new to milieu, continuing to assess  Participating in groups: Pt is new to milieu, continuing to assess  Taking medication as prescribed: Yes  Tolerating medication: Yes  Family/Significant othe contact made: No, CSW attempting to make contact with Pt's girlfriend Patient understands diagnosis: Yes AEB seeking help for depression, hallucinations, and substance abuse  Discussing patient identified problems/goals with staff: Yes  Medical problems stabilized or resolved: Yes  Denies suicidal/homicidal ideation: No, Pt recently admitted with SI Patient has not harmed self or Others: Yes   New problem(s) identified: None identified at this time.   Discharge Plan or Barriers: Pt requests referrals to [facility] and [facility].  Additional comments:  Patient and CSW reviewed pt's identified goals and treatment plan. Patient verbalized understanding and agreed to treatment plan. CSW reviewed [facility] "Discharge Process and Patient Involvement" Form. Pt verbalized understanding of information provided and signed form.   Reason for Continuation of Hospitalization:  Depression Medication stabilization Suicidal ideation Withdrawal symptoms Psychotic- Auditory Hallucinations  Estimated length of stay: 3-5 days  Review of initial/current patient goals per problem list:   1.  Goal(s): Patient will participate in aftercare plan  Met:  Progressing  Target date: 3-5 days from date of admission   As evidenced by: Patient will participate within aftercare plan AEB aftercare provider and housing plan at discharge being identified.   03/23/15: Pt requests referrals to [facility] and [facility]; pending.  2.  Goal (s): Patient will exhibit decreased depressive symptoms and suicidal ideations.  Met:  No  Target date: 3-5 days from date of admission   As evidenced by: Patient will  utilize self rating of depression at 3 or below and demonstrate decreased signs of depression or be deemed stable for discharge by MD. 03/23/15: Pt was admitted with symptoms of depression, rating 10/10. Pt continues to present with flat affect and depressive symptoms.  Pt will demonstrate decreased symptoms of depression and rate depression at 3/10 or lower prior to discharge.  4.  Goal(s): Patient will demonstrate decreased signs of withdrawal due to substance abuse  Met:  No  Target date: 3-5 days from date of admission   As evidenced by: Patient will produce a CIWA/COWS score of 0, have stable vitals signs, and no symptoms of withdrawal  03/23/15: Pt has COWS score of 4, endorsing runny nose, restlessness, yawning and anxiety as symptoms of withdrawal. 5.  Goal(s): Patient will demonstrate decreased signs of psychosis  . Met:  Yes . Target date: 3-5 days from date of admission  . As evidenced by: Patient will demonstrate decreased frequency of AVH or return to baseline function - 03/23/15: Pt denied AVH at time of admission.  Attendees:  Patient:    Family:    Physician: Dr. Shea Evans, MD  03/23/2015 11:30 AM  Nursing: 03/23/2015 11:30 AM  Clinical Social Worker Peri Maris, [place] 03/23/2015 11:30 AM  Other:  03/23/2015 11:30 AM  Clinical:  Manuella Ghazi, RN; Larrie Kass., RN 03/23/2015 11:30 AM  Other: , RN Charge Nurse 03/23/2015 11:30 AM  Other: Hilda Lias, [place], [place] Social Work [phone]

## 2015-03-23 NOTE — BHH Counselor (Signed)
Adult Comprehensive Assessment  Patient ID: Zachary Hull, male DOB: 1960/07/07, 55 y.o. MRN: [id number]  Information Source: Information source: Patient  Current Stressors:  Employment / Job issues: on disability Family Relationships: no family/friend support Surveyor, quantity / Lack of resources (include bankruptcy): on fixed income Housing: currently homeless Bereavement / Loss: both mother and father passed in past 6 years Substance Abuse:  Drinking daily, undetermined amount of cocaine use over the past 6 weeks Bereavement: Father died in 03/18/2015 Living/Environment/Situation:  Living Arrangements: Recently homeless- was kicked out of aunt's house due to drinking and behavior Living conditions (as described by patient or guardian): Stressful  How long has patient lived in current situation?: a week What is atmosphere in current home: Chaotic; Temporary  Family History:  Marital status: Divorced Divorced, when?: last year What types of issues is patient dealing with in the relationship?: pt reports he was drinking and his wife was tired of it Additional relationship information: N/A Are you sexually active?: No What is your sexual orientation?: heterosexual Has your sexual activity been affected by drugs, alcohol, medication, or emotional stress?: no Does patient have children?: No  Childhood History:  By whom was/is the patient raised?: Grandparents Additional childhood history information: Pt reports having a great childhood. Pt states that he was raised by his grandparents because his parents worked.  Description of patient's relationship with caregiver when they were a child: Pt reports getting along great with grandparents growing up.  Patient's description of current relationship with people who raised him/her: Grandparents and parents are deceased.  How were you disciplined when you got in trouble as a child/adolescent?: talked to Does patient have siblings?:  No Did patient suffer any verbal/emotional/physical/sexual abuse as a child?: No Did patient suffer from severe childhood neglect?: No Has patient ever been sexually abused/assaulted/raped as an adolescent or adult?: No Was the patient ever a victim of a crime or a disaster?: No Witnessed domestic violence?: No Has patient been effected by domestic violence as an adult?: No  Education:  Highest grade of school patient has completed: graduated high school Currently a Consulting civil engineer?: No Learning disability?: No  Employment/Work Situation:  Employment situation: On disability Why is patient on disability: mental health How long has patient been on disability: since 2009 Patient's job has been impacted by current illness: No What is the longest time patient has a held a job?: 2 years Where was the patient employed at that time?: [employer]NA Has patient ever been in the [employer]?: No Has patient ever served in combat?: No Did You Receive Any Psychiatric Treatment/Services While in Equities trader?: No Are There Guns or Other Weapons in Your Home?: No Are These Comptroller?: Yes  Financial Resources:  Surveyor, quantity resources: [employer], Medicaid Does patient have a Lawyer or guardian?: No  Alcohol/Substance Abuse:  What has been your use of drugs/alcohol within the last 12 months?: has been drinking half a gallon of liquor daily for the last month If attempted suicide, did drugs/alcohol play a role in this?: Yes Alcohol/Substance Abuse Treatment Hx: Past Tx, Inpatient, Attends AA/NA, Past detox, Past Tx, Outpatient If yes, describe treatment: Reports being inpatient numerous times for his drug/alcohol use - unable to name the centers Has alcohol/substance abuse ever caused legal problems?: Yes (DUI in 1999)  Social Support System:  Patient's Community Support System: Good Describe Community Support System: family is supportive but have set boundaries recently Type of  faith/religion: Pt denies How does patient's faith help to cope  with current illness?: N/A  Leisure/Recreation:  Leisure and Hobbies: watch sports  Strengths/Needs:  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 In what areas does patient struggle / problems for patient: depression, SI  Discharge Plan:  Does patient have access to transportation?: Yes Will patient be returning to same living situation after discharge?: No- Pt hoping to go to residential treatment facility Currently receiving community mental health services: No If no, would patient like referral for services when discharged?: Yes (What county?) (residential treatment facility: requests referrals to [facility] and [facility]) Does patient have financial barriers related to discharge medications?: No  Summary/Recommendations:   Patient is a 55 year old male with a diagnosis of Schizophrenia, Alcohol Use Disorder, severe, and Cocaine Use Disorder, severe. Pt presented to the hospital with thoughts of suicide, increased depression, auditory hallucinations and requesting detox. Pt reports primary trigger(s) for admission was uncontrolled substance abuse and grief related to his father's death. Patient will benefit from crisis stabilization, medication evaluation, group therapy and psycho education in addition to case management for discharge planning. At discharge it is recommended that Pt remain compliant with established discharge plan and continued treatment.  Chad Cordial, LCSWA Clinical Social Work [phone]

## 2015-03-23 NOTE — Progress Notes (Signed)
Pt is a 55 y/o AA male admitted onto the [facility] adult unit needing help with his cocaine and alcohol abuse; he states, "I hope I would be able to go to a long-term facility this time around; I can keep leaving like this. Pt also complained of severe depression with suicidal ideation-Pt contracted for safety. Pt also admitted to be non-compliant with his medication. Pt at the time of admission denies anxiety, pain, HI and AVH. Pt stated goals are "I need help getting into a long-term facility" and "Get my medication right." Support, encouragement, and safe environment provided.  15-minute safety checks initiated and continued. Pt remained calm and cooperative through the admission process.

## 2015-03-23 NOTE — [facility] Group Notes (Signed)
[facility] Group Notes:  (Nursing/MHT/Case Management/Adjunct)  Date:  03/23/2015  Time:  1:43 PM  Type of Therapy:  Nurse Education  Participation Level:  Did Not Attend     Mickie Bail 03/23/2015, 1:43 PM

## 2015-03-23 NOTE — B[facility] Suicide Risk Assessment (Signed)
B[facility] INPATIENT:  Family/Significant Other Suicide Prevention Education  Suicide Prevention Education:  Education Completed; Luella Cook, Pt's girlfriend [phone], has been identified by the patient as the family member/significant other with whom the patient will be residing, and identified as the person(s) who will aid the patient in the event of a mental health crisis (suicidal ideations/suicide attempt).  With written consent from the patient, the family member/significant other has been provided the following suicide prevention education, prior to the and/or following the discharge of the patient.  The suicide prevention education provided includes the following:  Suicide risk factors  Suicide prevention and interventions  National Suicide Hotline telephone number  [facility] assessment telephone number  [facility] Emergency Assistance 911  [facility] and/or Residential Mobile Crisis Unit telephone number  Request made of family/significant other to:  Remove weapons (e.g., guns, rifles, knives), all items previously/currently identified as safety concern.    Remove drugs/medications (over-the-counter, prescriptions, illicit drugs), all items previously/currently identified as a safety concern.  The family member/significant other verbalizes understanding of the suicide prevention education information provided.  The family member/significant other agrees to remove the items of safety concern listed above.  Elaina Hoops 03/23/2015, 12:42 PM

## 2015-03-23 NOTE — [facility] Group Notes (Signed)
B[facility] LCSW Group Therapy 03/23/2015 1:15pm  Type of Therapy: Group Therapy- [facility]  Pt did not attend, declined invitation.   Chad Cordial, LCSWA 03/23/2015 1:40 PM

## 2015-03-23 NOTE — Progress Notes (Signed)
Adult Psychoeducational Group Note  Date:  03/23/2015 Time:  8:51 PM  Group Topic/Focus:  Wrap-Up Group:   The focus of this group is to help patients review their daily goal of treatment and discuss progress on daily workbooks.  Participation Level:  Active  Participation Quality:  Appropriate  Affect:  Appropriate  Cognitive:  Appropriate  Insight: Appropriate  Engagement in Group:  Engaged  Modes of Intervention:  Discussion  Additional Comments:The patient expressed that he had a good day and rates today a 10.The patient also said that he attended group.  Octavio Manns 03/23/2015, 8:51 PM

## 2015-03-23 NOTE — Progress Notes (Signed)
NUTRITION ASSESSMENT  Pt identified as at risk on the Malnutrition Screen Tool  INTERVENTION: 1. Educated patient on the importance of nutrition and encouraged intake of food and beverages. 2. Discussed weight goals. 3. Supplements: none at this time  NUTRITION DIAGNOSIS: Unintentional weight loss related to sub-optimal intake as evidenced by pt report.   Goal: Pt to meet >/= 90% of their estimated nutrition needs.  Monitor:  PO intake  Assessment:  Pt seen for MST. Pt admitted for cocaine and alcohol abuse and per RN note at 0230 today he had indicated that he was hopeful that he was able to go to a long-term facility for polysubstance abuse. Pt also with severe depression and SI. He states that he has not been compliant with his medications PTA.   Per review, pt has lost 7 lbs (4% body weight) in the past 2 months which is not significant for time frame. No supplements warranted at this time; please provided pt with Ensure Enlive PRN should he request it.   54 y.o. male  Height: Ht Readings from Last 1 Encounters:  03/22/15  (1.6 m)    Weight: Wt Readings from Last 1 Encounters:  03/22/15 162 lb (73.483 kg)    Weight Hx: Wt Readings from Last 10 Encounters:  03/22/15 162 lb (73.483 kg)  02/02/15 165 lb (74.844 kg)  01/25/15 169 lb (76.658 kg)  12/17/14 169 lb (76.658 kg)  08/23/14 168 lb (76.204 kg)  03/09/12 152 lb (68.947 kg)    BMI:  Body mass index is 28.7 kg/(m^2). Pt meets criteria for overweight based on current BMI.  Estimated Nutritional Needs: Kcal: 25-30 kcal/kg Protein: > 1 gram protein/kg Fluid: 1 ml/kcal  Diet Order: Diet regular Room service appropriate?: Yes; Fluid consistency:: Thin Pt is also offered choice of unit snacks mid-morning and mid-afternoon.  Pt is eating as desired.   Lab results and medications reviewed.     Trenton Gammon, RD, LDN Inpatient Clinical Dietitian Pager # [phone] After hours/weekend pager #  [phone]

## 2015-03-23 NOTE — Progress Notes (Signed)
DAR NOTE: Patient presents with anxious affect and depressed mood.  Denies pain, auditory and visual hallucinations.  Rates depression at 5, hopelessness at 5, and anxiety at 5.  Maintained on routine safety checks.  Medications given as prescribed.  Support and encouragement offered as needed.  Attended group and participated.  States goal for today is "that I get into a treatment program long term and get back on my meds and continue."  Patient remained isolative to his room.  Reports suicidal thoughts but contracts for safety.  Offered no complaint.

## 2015-03-24 LAB — LIPID PANEL
Cholesterol: 181 mg/dL (ref 0–200)
HDL: 38 mg/dL — ABNORMAL LOW (ref >40)
LDL Cholesterol: 117 mg/dL — ABNORMAL HIGH (ref 0–99)
Total CHOL/HDL Ratio: 4.8 RATIO
Triglycerides: 129 mg/dL (ref <150)
VLDL: 26 mg/dL (ref 0–40)

## 2015-03-24 MED ORDER — FLUPHENAZINE HCL 2.5 MG PO TABS
7.5000 mg | ORAL_TABLET | Freq: Every day | ORAL | Status: DC
  Administered 2015-03-24: 7.5 mg via ORAL
  Filled 2015-03-24 (×3): qty 3

## 2015-03-24 NOTE — Progress Notes (Signed)
D: Patient denies SI at this time, states "it comes and goes", he does verbally contract for safety and denies any HI or AVH. Patient has a depressed mood and flat affect.  He is up and about interacting with others on the unit.  Pt. States that his appetite continues to improve and that his sleep is good.  Pt. Reports that he is attending groups and participating.     A: Patient given emotional support from RN. Patient encouraged to come to staff with concerns and/or questions. Patient's medication routine continued. Patient's orders and plan of care reviewed.   R: Patient remains appropriate and cooperative. Will continue to monitor patient q15 minutes for safety.

## 2015-03-24 NOTE — [facility] Group Notes (Signed)
[facility]   03/24/2015 1:12 PM  Participation Quality:  Active  Mood/Affect:  Appropriate  Depression Rating:  5  Anxiety Rating:  6  Thoughts of Suicide:  No Will you contract for safety?   NA  Current AVH:  No  Plan for Discharge/Comments:  Pt states that he is feeling "so-so" today. He says he would like to spend some time "getting himself back together". Pt will follow-up outpt with [facility] or [facility]. Still waiting on referral.   Transportation Means:   Supports:  Jonathon JordanLynn B Bryant
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
## 2015-03-24 NOTE — [facility] Group Notes (Signed)
[facility] LCSW Group Therapy  03/24/2015 1:15pm  Type of Therapy:  Group Therapy vercoming Obstacles  Participation Level:  Reserved  Participation Quality:  Appropriate   Affect:  Appropriate  Cognitive:  Appropriate and Oriented  Insight:  Developing/Improving and Improving  Engagement in Therapy:  Improving  Modes of Intervention:  Discussion, Exploration, Problem-solving and Support  Description of Group:   In this group patients will be encouraged to explore what they see as obstacles to their own wellness and recovery. They will be guided to discuss their thoughts, feelings, and behaviors related to these obstacles. The group will process together ways to cope with barriers, with attention given to specific choices patients can make. Each patient will be challenged to identify changes they are motivated to make in order to overcome their obstacles. This group will be process-oriented, with patients participating in exploration of their own experiences as well as giving and receiving support and challenge from other group members.  Summary of Patient Progress: Pt discussed maintaining sobriety as his primary obstacle. He discussed everyday stressors can be triggers for relapse. He expressed being hopeful that residential treatment will help start his recovery journey.   Therapeutic Modalities:   Cognitive Behavioral Therapy Solution Focused Therapy Motivational Interviewing Relapse Prevention Therapy   Chad CordialLauren Carter, LCSWA 03/24/2015 2:01 PM

## 2015-03-24 NOTE — Progress Notes (Signed)
DAR NOTE: Patient presents with anxious affect and depressed mood.  Denies pain, auditory and visual hallucinations.  describes energy level as low and concentration as poor.  Rates depression at 4, hopelessness at 4, and anxiety at 4.  Maintained on routine safety checks.  Medications given as prescribed.  Support and encouragement offered as needed.  Attended group and participated.  States goal for today is "going into a long term treatment facility."  Patient observed socializing with peers in the dayroom.  Reports withdrawal symptoms of tremors, cravings, agitation, nausea and irritability.  Librium protocol continues and maintained.

## 2015-03-24 NOTE — Progress Notes (Signed)
D: Pt is alert and oriented x 4. Pt endorses mild depression, mild anxiety with some SI; state, "I'm still depressed, still anxious and the thoughts are still there." Pt was able to contract for safety. Pt denies pain, HI and AVH. Pt remained calm and cooperative.  A: Medications offered as prescribed.  Support, encouragement, and safe environment provided R: Pt was med compliant.  Pt attended wrap-up group. Safety checks continue

## 2015-03-25 LAB — HEMOGLOBIN A1C
HEMOGLOBIN A1C: 5.4 % (ref 4.8–5.6)
Mean Plasma Glucose: 108 mg/dL

## 2015-03-25 MED ORDER — BENZTROPINE MESYLATE 1 MG PO TABS
1.0000 mg | ORAL_TABLET | Freq: Every day | ORAL | Status: DC
  Administered 2015-03-25 – 2015-03-28 (×4): 1 mg via ORAL
  Filled 2015-03-25 (×6): qty 1

## 2015-03-25 MED ORDER — TRAZODONE HCL 100 MG PO TABS
100.0000 mg | ORAL_TABLET | Freq: Every day | ORAL | Status: DC
  Administered 2015-03-25 – 2015-03-28 (×4): 100 mg via ORAL
  Filled 2015-03-25 (×6): qty 1

## 2015-03-25 MED ORDER — FLUPHENAZINE HCL 10 MG PO TABS
10.0000 mg | ORAL_TABLET | Freq: Every day | ORAL | Status: DC
  Administered 2015-03-25 – 2015-03-27 (×3): 10 mg via ORAL
  Filled 2015-03-25: qty 2
  Filled 2015-03-25: qty 1
  Filled 2015-03-25: qty 2
  Filled 2015-03-25 (×4): qty 1

## 2015-03-25 NOTE — [facility] Group Notes (Signed)
[facility] Group Notes:  (Nursing/MHT/Case Management/Adjunct)  Date:  03/25/2015  Time:  11:13 AM  Type of Therapy:  Nurse Education  Participation Level:  Did Not Attend    Zachary Hull 03/25/2015, 11:13 AM

## 2015-03-25 NOTE — Progress Notes (Signed)
DAR NOTE: Patient presents with anxious affect and depressed mood.  Denies pain, auditory and visual hallucinations.  Rates depression at 5, hopelessness at 5, and anxiety at 5.  Maintained on routine safety checks.  Medications given as prescribed.  Support and encouragement offered as needed.  Attended group and participated.  States goal for today is "to look for a long term facility for substance abuse."  Patient observed socializing with peers in the dayroom.

## 2015-03-25 NOTE — Progress Notes (Signed)
Adult Psychoeducational Group Note  Date:  03/25/2015 Time:  9:24 PM  Group Topic/Focus:  Wrap-Up Group:   The focus of this group is to help patients review their daily goal of treatment and discuss progress on daily workbooks.  Participation Level:  Active  Participation Quality:  Appropriate  Affect:  Appropriate  Cognitive:  Appropriate  Insight: Appropriate  Engagement in Group:  Engaged  Modes of Intervention:  Discussion  Additional Comments:  The patient expressed that he atetnded groups and had a good day.  Octavio Mannshigpen, Oprah Camarena Lee 03/25/2015, 9:24 PM

## 2015-03-25 NOTE — [facility] Group Notes (Signed)
[facility] Group Notes: (Clinical Social Work)   03/25/2015      Type of Therapy:  Group Therapy   Participation Level:  Did Not Attend despite MHT prompting   Zachary MantleMareida Grossman-Orr, LCSW 03/25/2015, 12:14 PM
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
## 2015-03-25 NOTE — [facility] Group Notes (Signed)
Adult Psychoeducational Group Note  Date:  03/25/2015 Time:  12:01 AM  Group Topic/Focus:  Wrap-Up Group:   The focus of this group is to help patients review their daily goal of treatment and discuss progress on daily workbooks.  Participation Level:  Active  Participation Quality:  Appropriate  Affect:  Appropriate  Cognitive:  Appropriate  Insight: Good  Engagement in Group:  Engaged  Modes of Intervention:  Discussion  Additional Comments:  Pt rated his day a 7 out of 10.  His goal was to try to get into the [facility] but got side tracked and will try again.    Caroll RancherLindsay, Pearson Reasons A 03/25/2015, 12:01 AM

## 2015-03-26 NOTE — Plan of Care (Signed)
Problem: Alteration in mood Goal: LTG-Patient reports reduction in suicidal thoughts (Patient reports reduction in suicidal thoughts and is able to verbalize a safety plan for whenever patient is feeling suicidal)  Outcome: Progressing Pt denies SI and verbally contracts for safety tonight.

## 2015-03-26 NOTE — Progress Notes (Signed)
Patient ID: Zachary Hull, male   DOB: [birth year], 55 y.o.   MRN: [id number]   D: Pt has been very agitated and irritable on the unit today. Pt was in the bed most of the morning, he did not attend any groups nor did he engage in treatment. Pt reported that he just wanted his meds and to be left alone. Pt reported that her depression was a 5, her hopelessness was a 5, and her anxiety was a 5. Pt reported that his goal for today was to work on self and get into a program. Pt reported being negative SI/HI, no AH/VH noted. A: 15 min checks continued for patient safety. R: Pt safety maintained.
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
## 2015-03-26 NOTE — [facility] Group Notes (Signed)
[facility] Group Notes:  (Clinical Social Work)  03/26/2015  11:00AM-12:00PM  Summary of Progress/Problems:  The main focus of today's process group was to listen to a variety of genres of music and to identify that different types of music provoke different responses.  The patient then was able to identify personally what was soothing for them, as well as energizing.  Today in group, there were many patients singing, a few dancing, and many visibly enjoyed themselves, were encouraging to others to participate. The patient expressed understanding of concepts, as well as knowledge of how each type of music affected him and how this can be used at home as a wellness/recovery tool.  The patient was one of the people singing and visible enjoying himself.  He had been late to group, so did not have the opportunity to say at the beginning of group how he felt, but at the end of group he stated he felt "better."  Type of Therapy:  Music Therapy   Participation Level:  Active  Participation Quality:  Attentive   Affect:  Blunted  Cognitive:  Oriented  Insight:  Engaged  Engagement in Therapy:  Engaged  Modes of Intervention:   Activity, Exploration  Ambrose MantleMareida Grossman-Orr, LCSW 03/26/2015 2:43 PM

## 2015-03-26 NOTE — Progress Notes (Signed)
Adult Psychoeducational Group Note  Date:  03/26/2015 Time:  8:41 PM  Group Topic/Focus:  Wrap-Up Group:   The focus of this group is to help patients review their daily goal of treatment and discuss progress on daily workbooks.  Participation Level:  Active  Participation Quality:  Appropriate  Affect:  Appropriate  Cognitive:  Appropriate  Insight: Appropriate  Engagement in Group:  Engaged  Modes of Intervention:  Discussion  Additional Comments: The patient attended group.  Octavio Mannshigpen, Dorthie Santini Lee 03/26/2015, 8:41 PM

## 2015-03-26 NOTE — Progress Notes (Signed)
D: Pt has labile affect and mood.  He was in his room visiting with his fiance upon initial approach.  He reported increased anxiety and was irritable at the beginning of the shift.  He repeatedly asked writer to put a note in for provider to communicate that he would like to start taking Adderall.  He reports his visit with his fiance went well.  Pt denies SI/HI, denies hallucinations, denies pain.  Pt has been visible in milieu interacting with peers appropriately.  Pt attended evening group.   A: Introduced self to pt.  Met with pt and offered support and encouragement.  Actively listened to pt.  Medications administered per order.  PRN medication administered for anxiety/agitation. R: Pt is compliant with medications.  Pt verbally contracts for safety and reports that he will inform staff of needs and concerns.  He gradually de-escalated after PRN medication administration.  Will continue to monitor and assess.

## 2015-03-27 MED ORDER — DULOXETINE HCL 30 MG PO CPEP
30.0000 mg | ORAL_CAPSULE | Freq: Every day | ORAL | Status: DC
Start: 2015-03-27 — End: 2015-03-29
  Administered 2015-03-27 – 2015-03-29 (×3): 30 mg via ORAL
  Filled 2015-03-27 (×5): qty 1

## 2015-03-27 NOTE — [facility] Group Notes (Signed)
[facility] LCSW Group Therapy  03/27/2015 1:15 pm  Type of Therapy: Process Group Therapy  Participation Level:  Active  Participation Quality:  Appropriate  Affect:  Flat  Cognitive:  Oriented  Insight:  Improving  Engagement in Group:  Limited  Engagement in Therapy:  Limited  Modes of Intervention:  Activity, Clarification, Education, Problem-solving and Support  Summary of Progress/Problems: Today's group addressed the issue of overcoming obstacles.  Patients were asked to identify their biggest obstacle post d/c that stands in the way of their on-going success, and then problem solve as to how to manage this. Invited.  Chose to not attend.  Zachary Hull, Zachary Hull 03/27/2015   2:53 PM

## 2015-03-27 NOTE — Progress Notes (Signed)
DAR NOTE: Patient mood and affect remained depressed.  Reports having suicidal thoughts and racing thoughts but contracts for safety.  Denies pain, auditory and visual hallucinations.  Denies withdrawal any symptoms.  withdrawal assessment still maintained.  Rates depression at 3, hopelessness at 3, and anxiety at 3.  Maintained on routine safety checks.  Medications given as prescribed.  Support and encouragement offered as needed.  Attended group and participated.  States goal for today is "self."  Patient remained isolative to his room.  Minimal interaction with staff and peers.
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
## 2015-03-27 NOTE — Progress Notes (Signed)
D: Pt is alert and oriented x 4. Pt endorses severe anxiety with mild depression; he state, "I was fine until my two girlfriends came to visit the same time today." Pt denies pain, SI, HI and AVH. Pt is very irritable, was seen making several phone calls. Pt looks very worried.   A: Medications offered as prescribed.  Support, encouragement, and safe environment provided  R: Pt was med compliant.  Pt attended wrap-up group. Safety checks continue

## 2015-03-28 MED ORDER — FLUPHENAZINE HCL 10 MG PO TABS
15.0000 mg | ORAL_TABLET | Freq: Every day | ORAL | Status: DC
  Administered 2015-03-28: 15 mg via ORAL
  Filled 2015-03-28 (×2): qty 3

## 2015-03-28 NOTE — Tx Team (Signed)
Interdisciplinary Treatment Plan Update (Adult) Date: 03/28/2015   Date: 03/28/2015 5:34 PM  Progress in Treatment:  Attending groups: Pt is new to milieu, continuing to assess  Participating in groups: Pt is new to milieu, continuing to assess  Taking medication as prescribed: Yes  Tolerating medication: Yes  Family/Significant othe contact made: Yes Patient understands diagnosis: Yes AEB seeking help for depression, hallucinations, and substance abuse  Discussing patient identified problems/goals with staff: Yes  Medical problems stabilized or resolved: Yes  Denies suicidal/homicidal ideation: No, Pt recently admitted with SI Patient has not harmed self or Others: Yes   New problem(s) identified: None identified at this time.   Discharge Plan or Barriers: Pt requests referrals to [facility] and [facility].  Additional comments:  Patient and CSW reviewed pt's identified goals and treatment plan. Patient verbalized understanding and agreed to treatment plan. CSW reviewed [facility] "Discharge Process and Patient Involvement" Form. Pt verbalized understanding of information provided and signed form.   Reason for Continuation of Hospitalization:    Estimated length of stay: D/C tomorrow  Review of initial/current patient goals per problem list:   1.  Goal(s): Patient will participate in aftercare plan  Met:  Yes  Target date: 3-5 days from date of admission   As evidenced by: Patient will participate within aftercare plan AEB aftercare provider and housing plan at discharge being identified.   03/23/15: Pt requests referrals to [facility] and [facility]; pending.  03/28/15:  Transfer to [facility] tomorrow  2.  Goal (s): Patient will exhibit decreased depressive symptoms and suicidal ideations.  Met:  Yes  Target date: 3-5 days from date of admission   As evidenced by: Patient will utilize self rating of depression at 3 or below and demonstrate decreased signs of depression or be deemed stable for discharge  by MD. 03/23/15: Pt was admitted with symptoms of depression, rating 10/10. Pt continues to present with flat affect and depressive symptoms.  Pt will demonstrate decreased symptoms of depression and rate depression at 3/10 or lower prior to discharge. 03/28/15:  Pt rates depression a 2 today  4.  Goal(s): Patient will demonstrate decreased signs of withdrawal due to substance abuse  Met:  Yes  Target date: 3-5 days from date of admission   As evidenced by: Patient will produce a CIWA/COWS score of 0, have stable vitals signs, and no symptoms of withdrawal  03/23/15: Pt has COWS score of 4, endorsing runny nose, restlessness, yawning and anxiety as symptoms of withdrawal.  03/28/15:  No signs nor symptoms of withdrawal today   5.  Goal(s): Patient will demonstrate decreased signs of psychosis  . Met:  Yes . Target date: 3-5 days from date of admission  . As evidenced by: Patient will demonstrate decreased frequency of AVH or return to baseline function - 03/23/15: Pt denied AVH at time of admission.  Attendees:  Patient:    Family:    Physician: Dr. Shea Evans, MD  03/28/2015 5:34 PM  Nursing: 03/28/2015 5:34 PM  Clinical Social Worker  [place]  03/28/2015 5:34 PM  Other:  03/28/2015 5:34 PM  Clinical:  Manuella Ghazi, RN;  03/28/2015 5:34 PM  Other: , RN Charge Nurse 03/28/2015 5:34 PM  Other: Hilda Lias, [place] 03/29/2015
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
## 2015-03-28 NOTE — Progress Notes (Signed)
D: Pt is alert and oriented x 4. Pt endorses severe anxiety with mild depression; he state, "All these extra drama from my girlfriend is not helping at all." Pt denies pain, SI, HI and AVH. Pt continues to be irritable, was seen making several phone calls. Pt looks very worried.   A: Medications offered as prescribed.  Support, encouragement, and safe environment provided R: Pt was med compliant. Safety checks continue

## 2015-03-28 NOTE — [facility] Group Notes (Signed)
[facility] Group Notes:  (Nursing/MHT/Case Management/Adjunct)  Date:  03/28/2015  Time:  10:31 AM  Type of Therapy:  Nurse Education  Participation Level:  Did Not Attend    Zachary Hull 03/28/2015, 10:31 AM

## 2015-03-28 NOTE — Progress Notes (Signed)
DAR NOTE: Patient presents with depressed mood and affect.  Denies pain, auditory and visual hallucinations.  Rates depression at 4, hopelessness at 4, and anxiety at 4.  Maintained on routine safety checks.  Medications given as prescribed.  Support and encouragement offered as needed.   Patient preoccupied with pending court case.  Patient reports racing and suicidal thoughts but contracts for safety.  Patient observed yelling and using inappropriate language while using the phone.  Patient redirected several times. Patient returned to his room and remained in his room most of the shift.  Patient did not attend group.

## 2015-03-28 NOTE — [facility] Group Notes (Signed)
[facility] LCSW Group Therapy  03/28/2015 , 5:34 PM   Type of Therapy:  Group Therapy  Participation Level:  Active  Participation Quality:  Attentive  Affect:  Appropriate  Cognitive:  Alert  Insight:  Improving  Engagement in Therapy:  Engaged  Modes of Intervention:  Discussion, Exploration and Socialization  Summary of Progress/Problems: Today's group focused on the term Diagnosis.  Participants were asked to define the term, and then pronounce whether it is a negative, positive or neutral term. Invited.  Chose to not attend  Zachary Hull, Zachary Hull 03/28/2015 , 5:34 PM

## 2015-03-29 MED ORDER — NICOTINE 21 MG/24HR TD PT24: 21.0000 mg | MEDICATED_PATCH | Freq: Every day | TRANSDERMAL | Status: DC

## 2015-03-29 MED ORDER — MIRTAZAPINE 15 MG PO TABS: 15.0000 mg | ORAL_TABLET | Freq: Every day | ORAL | Status: DC

## 2015-03-29 MED ORDER — TRAZODONE HCL 100 MG PO TABS: 100.0000 mg | ORAL_TABLET | Freq: Every day | ORAL | Status: DC

## 2015-03-29 MED ORDER — FLUPHENAZINE HCL 5 MG PO TABS: 15.0000 mg | ORAL_TABLET | Freq: Every day | ORAL | Status: DC

## 2015-03-29 MED ORDER — DULOXETINE HCL 30 MG PO CPEP: 30.0000 mg | ORAL_CAPSULE | Freq: Every day | ORAL | Status: DC

## 2015-03-29 MED ORDER — BENZTROPINE MESYLATE 1 MG PO TABS: 1.0000 mg | ORAL_TABLET | Freq: Every day | ORAL | Status: DC
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
## 2015-03-29 NOTE — Progress Notes (Signed)
D: Patient denies SI/HI or AVH.  Pt. Forwards little, he answers questions appropriately but seems preoccupied.  Pt. States sleep and appetite are good.  No acute distress noted.   A: Patient given emotional support from RN. Patient encouraged to come to staff with concerns and/or questions. Patient's medication routine continued. Patient's orders and plan of care reviewed.   R: Patient remains appropriate and cooperative. Will continue to monitor patient q15 minutes for safety.

## 2015-03-29 NOTE — Progress Notes (Signed)
  [facility] Discharge [facility] :  Will you be returning to the same living situation after discharge:  No. At discharge, do you have transportation home?: Yes,  [facility] Do you have the ability to pay for your medications: Yes,  MCD  Release of information consent forms completed and in the chart;  Patient's signature needed at discharge.  Patient to Follow up at: Follow-up Information    Follow up with [facility].   Why:  Will pick you up the day of d/c   Contact information:   [address]  [place] [phone]      Next level of care provider has access to [facility] Link:no  Safety Planning and Suicide Prevention discussed: Yes,  yes  Have you used any form of tobacco in the last 30 days? (Cigarettes, Smokeless Tobacco, Cigars, and/or Pipes): Yes  Has patient been referred to the Quitline?: Patient refused referral  Patient has been referred for addiction treatment: Yes  [name], Kaedynce Tapp B 03/29/2015, 10:13 AM

## 2015-03-29 NOTE — Progress Notes (Signed)
Pt. Discharged per MD orders;  PT. Currently denies any HI/SI or AVH.  Pt. Was given education regarding follow up appointments and medications by RN.  Pt. Denies any questions or concerns about the medications.  Pt. Was escorted to the search room to retrieve his belongings by RN before being discharged to the hospital lobby.  

## 2015-03-29 NOTE — [facility] Suicide Risk Assessment (Signed)
[facility]   Principal Problem: Schizophrenia, undifferentiated (HCC) Discharge Diagnoses:  Patient Active Problem List   Diagnosis Date Noted  . Cocaine use disorder, severe, dependence (HCC) [F14.20] 01/26/2015  . Schizophrenia, undifferentiated (HCC) [F20.3] 01/25/2015  . Cannabis use disorder, moderate, dependence (HCC) [F12.20] 12/19/2014  . Alcohol use disorder, severe, dependence (HCC) [F10.20] 12/16/2014    Total Time spent with patient: 30 minutes  Musculoskeletal: Strength & Muscle Tone: within normal limits Gait & Station: normal Patient leans: N/A  Psychiatric Specialty Exam: Review of Systems  Psychiatric/Behavioral: Positive for substance abuse. Negative for depression, suicidal ideas and hallucinations. The patient is not nervous/anxious and does not have insomnia.   All other systems reviewed and are negative.   Blood pressure 112/72, pulse 89, temperature 98.2 F (36.8 C), temperature source Oral, resp. rate 18, height 5\' 3"  (1.6 m), weight 73.483 kg (162 lb).Body mass index is 28.7 kg/(m^2).  General Appearance: Casual  Eye Contact::  Good  Speech:  Normal Rate409  Volume:  Normal  Mood:  Euthymic  Affect:  Appropriate  Thought Process:  Coherent  Orientation:  Full (Time, Place, and Person)  Thought Content:  WDL  Suicidal Thoughts:  No  Homicidal Thoughts:  No  Memory:  Immediate;   Fair Recent;   Fair Remote;   Fair  Judgement:  Fair  Insight:  Fair  Psychomotor Activity:  Normal  Concentration:  Fair  Recall:  FiservFair  Fund of Knowledge:Fair  Language: Fair  Akathisia:  No  Handed:  Right  AIMS (if indicated):   0  Assets:  Desire for Improvement  Sleep:  Number of Hours: 6.75  Cognition: WNL  ADL's:  Intact   Mental Status Per Nursing Assessment::   On Admission:  Suicidal ideation indicated by patient  Demographic Factors:  Male  Loss Factors: Legal issues  Historical Factors: Impulsivity  Risk  Reduction Factors:   Positive social support  Continued Clinical Symptoms:  Alcohol/Substance Abuse/Dependencies Previous Psychiatric Diagnoses and Treatments  Cognitive Features That Contribute To Risk:  Polarized thinking    Suicide Risk:  Minimal: No identifiable suicidal ideation.  Patients presenting with no risk factors but with morbid ruminations; may be classified as minimal risk based on the severity of the depressive symptoms    Plan Of Care/Follow-up recommendations:  Activity:  no restrictions Diet:  regular Tests:  as needed Other:  follow up with aftercare  Tonishia Steffy, MD 03/29/2015, 9:18 AM

## 2015-03-29 NOTE — Progress Notes (Signed)
D. Pt had been up and visible in milieu this evening, seen interacting appropriately with peers. Pt did endorse anxiety and spoke of on-going racing thoughts and insomnia and spoke about how he has troubles sleeping because of the racing thoughts. Pt spoke about feeling tired tonight and received bedtime medications without incident and did not verbalize any complaints of pain. A. Support and encouragement provided. R. Safety maintained, will continue to monitor.

## 2015-05-08 ENCOUNTER — Encounter (HOSPITAL_COMMUNITY): Payer: Self-pay | Admitting: Oncology

## 2015-05-08 ENCOUNTER — Emergency Department (HOSPITAL_COMMUNITY)
Admission: EM | Admit: 2015-05-08 | Discharge: 2015-05-08 | Disposition: A | Discharge status: Other Acute Inpatient Hospital | Payer: Medicaid Other | Attending: Emergency Medicine | Admitting: Emergency Medicine

## 2015-05-08 ENCOUNTER — Encounter (HOSPITAL_COMMUNITY): Payer: Self-pay | Admitting: Psychiatry

## 2015-05-08 ENCOUNTER — Inpatient Hospital Stay (HOSPITAL_COMMUNITY)
Admission: AD | Admit: 2015-05-08 | Discharge: 2015-05-13 | DRG: 885 | Disposition: A | Discharge status: Home / Self Care | Payer: Medicaid Other | Source: Intra-hospital | Attending: Psychiatry | Admitting: Psychiatry

## 2015-05-08 DIAGNOSIS — F1020 Alcohol dependence, uncomplicated: Secondary | ICD-10-CM | POA: Diagnosis present

## 2015-05-08 DIAGNOSIS — F1410 Cocaine abuse, uncomplicated: Secondary | ICD-10-CM | POA: Insufficient documentation

## 2015-05-08 DIAGNOSIS — F209 Schizophrenia, unspecified: Secondary | ICD-10-CM | POA: Diagnosis present

## 2015-05-08 DIAGNOSIS — F1420 Cocaine dependence, uncomplicated: Secondary | ICD-10-CM | POA: Diagnosis present

## 2015-05-08 DIAGNOSIS — F919 Conduct disorder, unspecified: Secondary | ICD-10-CM | POA: Insufficient documentation

## 2015-05-08 DIAGNOSIS — F14929 Cocaine use, unspecified with intoxication, unspecified: Secondary | ICD-10-CM | POA: Diagnosis present

## 2015-05-08 DIAGNOSIS — Z79899 Other long term (current) drug therapy: Secondary | ICD-10-CM | POA: Diagnosis not present

## 2015-05-08 DIAGNOSIS — F14959 Cocaine use, unspecified with cocaine-induced psychotic disorder, unspecified: Secondary | ICD-10-CM

## 2015-05-08 DIAGNOSIS — Z9114 Patient's other noncompliance with medication regimen: Secondary | ICD-10-CM

## 2015-05-08 DIAGNOSIS — F329 Major depressive disorder, single episode, unspecified: Secondary | ICD-10-CM | POA: Diagnosis not present

## 2015-05-08 DIAGNOSIS — F17210 Nicotine dependence, cigarettes, uncomplicated: Secondary | ICD-10-CM | POA: Diagnosis present

## 2015-05-08 DIAGNOSIS — F419 Anxiety disorder, unspecified: Secondary | ICD-10-CM | POA: Insufficient documentation

## 2015-05-08 DIAGNOSIS — F1220 Cannabis dependence, uncomplicated: Secondary | ICD-10-CM | POA: Diagnosis not present

## 2015-05-08 DIAGNOSIS — R45851 Suicidal ideations: Secondary | ICD-10-CM | POA: Diagnosis present

## 2015-05-08 DIAGNOSIS — J189 Pneumonia, unspecified organism: Secondary | ICD-10-CM | POA: Diagnosis not present

## 2015-05-08 DIAGNOSIS — Y906 Blood alcohol level of 120-199 mg/100 ml: Secondary | ICD-10-CM | POA: Diagnosis present

## 2015-05-08 DIAGNOSIS — F203 Undifferentiated schizophrenia: Secondary | ICD-10-CM | POA: Diagnosis present

## 2015-05-08 DIAGNOSIS — B950 Streptococcus, group A, as the cause of diseases classified elsewhere: Secondary | ICD-10-CM | POA: Clinically undetermined

## 2015-05-08 LAB — CBC
HEMATOCRIT: 46.7 % (ref 39.0–52.0)
HEMOGLOBIN: 16.8 g/dL (ref 13.0–17.0)
MCH: 33.5 pg (ref 26.0–34.0)
MCHC: 36 g/dL (ref 30.0–36.0)
MCV: 93.2 fL (ref 78.0–100.0)
Platelets: 272 10*3/uL (ref 150–400)
RBC: 5.01 MIL/uL (ref 4.22–5.81)
RDW: 13.4 % (ref 11.5–15.5)
WBC: 8.2 10*3/uL (ref 4.0–10.5)

## 2015-05-08 LAB — ACETAMINOPHEN LEVEL

## 2015-05-08 LAB — COMPREHENSIVE METABOLIC PANEL
ALBUMIN: 4.9 g/dL (ref 3.5–5.0)
ALT: 29 U/L (ref 17–63)
ANION GAP: 11 (ref 5–15)
AST: 24 U/L (ref 15–41)
Alkaline Phosphatase: 72 U/L (ref 38–126)
BILIRUBIN TOTAL: 1.6 mg/dL — AB (ref 0.3–1.2)
BUN: 10 mg/dL (ref 6–20)
CO2: 20 mmol/L — AB (ref 22–32)
Calcium: 9.8 mg/dL (ref 8.9–10.3)
Chloride: 106 mmol/L (ref 101–111)
Creatinine, Ser: 0.84 mg/dL (ref 0.61–1.24)
GFR calc non Af Amer: >60 mL/min (ref >60)
GLUCOSE: 89 mg/dL (ref 65–99)
POTASSIUM: 3.9 mmol/L (ref 3.5–5.1)
SODIUM: 137 mmol/L (ref 135–145)
TOTAL PROTEIN: 7.6 g/dL (ref 6.5–8.1)

## 2015-05-08 LAB — RAPID URINE DRUG SCREEN, HOSP PERFORMED
Amphetamines: NOT DETECTED
BARBITURATES: NOT DETECTED
BENZODIAZEPINES: NOT DETECTED
COCAINE: POSITIVE — AB
OPIATES: NOT DETECTED
Tetrahydrocannabinol: NOT DETECTED

## 2015-05-08 LAB — SALICYLATE LEVEL

## 2015-05-08 LAB — ETHANOL: Alcohol, Ethyl (B): 163 mg/dL — ABNORMAL HIGH (ref <5)

## 2015-05-08 MED ORDER — FLUPHENAZINE HCL 5 MG PO TABS
5.0000 mg | ORAL_TABLET | Freq: Every day | ORAL | Status: DC
Start: 2015-05-08 — End: 2015-05-08
  Filled 2015-05-08: qty 1

## 2015-05-08 MED ORDER — CHLORDIAZEPOXIDE HCL 25 MG PO CAPS
25.0000 mg | ORAL_CAPSULE | ORAL | Status: AC
  Administered 2015-05-10 – 2015-05-11 (×2): 25 mg via ORAL
  Filled 2015-05-08 (×2): qty 1

## 2015-05-08 MED ORDER — VITAMIN B-1 100 MG PO TABS
100.0000 mg | ORAL_TABLET | Freq: Every day | ORAL | Status: DC
Start: 2015-05-09 — End: 2015-05-13
  Administered 2015-05-09 – 2015-05-13 (×5): 100 mg via ORAL
  Filled 2015-05-08 (×7): qty 1

## 2015-05-08 MED ORDER — CHLORDIAZEPOXIDE HCL 25 MG PO CAPS: 25.0000 mg | ORAL_CAPSULE | Freq: Three times a day (TID) | ORAL | Status: DC

## 2015-05-08 MED ORDER — CHLORDIAZEPOXIDE HCL 25 MG PO CAPS
25.0000 mg | ORAL_CAPSULE | Freq: Every day | ORAL | Status: AC
  Administered 2015-05-12: 25 mg via ORAL
  Filled 2015-05-08: qty 1

## 2015-05-08 MED ORDER — MAGNESIUM HYDROXIDE 400 MG/5ML PO SUSP: 30.0000 mL | Freq: Every day | ORAL | Status: DC | PRN

## 2015-05-08 MED ORDER — CHLORDIAZEPOXIDE HCL 25 MG PO CAPS: 25.0000 mg | ORAL_CAPSULE | Freq: Every day | ORAL | Status: DC

## 2015-05-08 MED ORDER — HYDROXYZINE HCL 25 MG PO TABS
25.0000 mg | ORAL_TABLET | Freq: Four times a day (QID) | ORAL | Status: AC | PRN
  Administered 2015-05-10: 25 mg via ORAL
  Filled 2015-05-08: qty 1

## 2015-05-08 MED ORDER — FLUPHENAZINE HCL 5 MG PO TABS: 15.0000 mg | ORAL_TABLET | Freq: Every day | ORAL | Status: DC

## 2015-05-08 MED ORDER — BENZTROPINE MESYLATE 0.5 MG PO TABS
0.5000 mg | ORAL_TABLET | Freq: Every day | ORAL | Status: DC
  Administered 2015-05-08 – 2015-05-12 (×5): 0.5 mg via ORAL
  Filled 2015-05-08 (×9): qty 1

## 2015-05-08 MED ORDER — ONDANSETRON 4 MG PO TBDP
4.0000 mg | ORAL_TABLET | Freq: Three times a day (TID) | ORAL | Status: DC | PRN
Start: 2015-05-08 — End: 2015-05-08
  Administered 2015-05-08: 4 mg via ORAL
  Filled 2015-05-08: qty 1

## 2015-05-08 MED ORDER — NICOTINE 21 MG/24HR TD PT24
21.0000 mg | MEDICATED_PATCH | Freq: Every day | TRANSDERMAL | Status: DC
  Administered 2015-05-08 – 2015-05-13 (×4): 21 mg via TRANSDERMAL
  Filled 2015-05-08 (×9): qty 1

## 2015-05-08 MED ORDER — CHLORDIAZEPOXIDE HCL 25 MG PO CAPS
25.0000 mg | ORAL_CAPSULE | Freq: Four times a day (QID) | ORAL | Status: DC
  Administered 2015-05-08: 25 mg via ORAL
  Filled 2015-05-08: qty 1

## 2015-05-08 MED ORDER — VITAMIN B-1 100 MG PO TABS
100.0000 mg | ORAL_TABLET | Freq: Every day | ORAL | Status: DC
  Administered 2015-05-08: 100 mg via ORAL
  Filled 2015-05-08: qty 1

## 2015-05-08 MED ORDER — FLUPHENAZINE HCL 5 MG PO TABS
15.0000 mg | ORAL_TABLET | Freq: Every day | ORAL | Status: DC
  Administered 2015-05-08 – 2015-05-11 (×4): 15 mg via ORAL
  Filled 2015-05-08 (×5): qty 1

## 2015-05-08 MED ORDER — ACETAMINOPHEN 325 MG PO TABS
650.0000 mg | ORAL_TABLET | Freq: Four times a day (QID) | ORAL | Status: DC | PRN
Start: 2015-05-08 — End: 2015-05-13
  Administered 2015-05-10 – 2015-05-12 (×4): 650 mg via ORAL
  Filled 2015-05-08 (×4): qty 2

## 2015-05-08 MED ORDER — CHLORDIAZEPOXIDE HCL 25 MG PO CAPS: 25.0000 mg | ORAL_CAPSULE | Freq: Four times a day (QID) | ORAL | Status: DC | PRN

## 2015-05-08 MED ORDER — TRAZODONE HCL 100 MG PO TABS
100.0000 mg | ORAL_TABLET | Freq: Every day | ORAL | Status: DC
  Administered 2015-05-08 – 2015-05-09 (×2): 100 mg via ORAL
  Filled 2015-05-08 (×4): qty 1

## 2015-05-08 MED ORDER — FLUPHENAZINE HCL 5 MG PO TABS: 5.0000 mg | ORAL_TABLET | Freq: Every day | ORAL | Status: DC

## 2015-05-08 MED ORDER — CHLORDIAZEPOXIDE HCL 25 MG PO CAPS: 25.0000 mg | ORAL_CAPSULE | ORAL | Status: DC

## 2015-05-08 MED ORDER — ONDANSETRON 4 MG PO TBDP: 4.0000 mg | ORAL_TABLET | Freq: Four times a day (QID) | ORAL | Status: DC | PRN

## 2015-05-08 MED ORDER — ADULT MULTIVITAMIN W/MINERALS CH
1.0000 | ORAL_TABLET | Freq: Every day | ORAL | Status: DC
  Administered 2015-05-08: 1 via ORAL
  Filled 2015-05-08: qty 1

## 2015-05-08 MED ORDER — HYDROXYZINE HCL 25 MG PO TABS: 25.0000 mg | ORAL_TABLET | Freq: Four times a day (QID) | ORAL | Status: DC | PRN

## 2015-05-08 MED ORDER — ALUM & MAG HYDROXIDE-SIMETH 200-200-20 MG/5ML PO SUSP: 30.0000 mL | ORAL | Status: DC | PRN

## 2015-05-08 MED ORDER — LOPERAMIDE HCL 2 MG PO CAPS: 2.0000 mg | ORAL_CAPSULE | ORAL | Status: AC | PRN

## 2015-05-08 MED ORDER — THIAMINE HCL 100 MG/ML IJ SOLN: 100.0000 mg | Freq: Once | INTRAMUSCULAR | Status: DC

## 2015-05-08 MED ORDER — CHLORDIAZEPOXIDE HCL 25 MG PO CAPS: 25.0000 mg | ORAL_CAPSULE | Freq: Four times a day (QID) | ORAL | Status: AC | PRN

## 2015-05-08 MED ORDER — ONDANSETRON 4 MG PO TBDP: 4.0000 mg | ORAL_TABLET | Freq: Three times a day (TID) | ORAL | Status: DC | PRN

## 2015-05-08 MED ORDER — BENZTROPINE MESYLATE 1 MG PO TABS: 1.0000 mg | ORAL_TABLET | Freq: Every day | ORAL | Status: DC

## 2015-05-08 MED ORDER — CHLORDIAZEPOXIDE HCL 25 MG PO CAPS
25.0000 mg | ORAL_CAPSULE | Freq: Three times a day (TID) | ORAL | Status: AC
  Administered 2015-05-09 – 2015-05-10 (×2): 25 mg via ORAL
  Filled 2015-05-08 (×3): qty 1

## 2015-05-08 MED ORDER — CHLORDIAZEPOXIDE HCL 25 MG PO CAPS
25.0000 mg | ORAL_CAPSULE | Freq: Four times a day (QID) | ORAL | Status: AC
  Administered 2015-05-08 – 2015-05-09 (×3): 25 mg via ORAL
  Filled 2015-05-08 (×3): qty 1

## 2015-05-08 MED ORDER — TRAZODONE HCL 100 MG PO TABS: 100.0000 mg | ORAL_TABLET | Freq: Every day | ORAL | Status: DC

## 2015-05-08 MED ORDER — BENZTROPINE MESYLATE 1 MG PO TABS: 0.5000 mg | ORAL_TABLET | Freq: Every day | ORAL | Status: DC

## 2015-05-08 MED ORDER — ADULT MULTIVITAMIN W/MINERALS CH
1.0000 | ORAL_TABLET | Freq: Every day | ORAL | Status: DC
Start: 2015-05-09 — End: 2015-05-13
  Administered 2015-05-09 – 2015-05-13 (×5): 1 via ORAL
  Filled 2015-05-08 (×7): qty 1

## 2015-05-08 MED ORDER — FLUPHENAZINE HCL 5 MG PO TABS
ORAL_TABLET | ORAL | Status: AC
  Filled 2015-05-08: qty 1

## 2015-05-08 MED ORDER — LOPERAMIDE HCL 2 MG PO CAPS: 2.0000 mg | ORAL_CAPSULE | ORAL | Status: DC | PRN

## 2015-05-08 NOTE — ED Notes (Signed)
Pt. Transferred to [facility] from ED to room. Report from CiscoKari RN. Pt. Oriented to unit including Q15 minute rounds as well as the security cameras for their protection. Patient is alert and oriented, warm and dry in no acute distress. Patient denies HI, but states he has SI to shoot himself and hears his mother telling him to shoot himself. Pt. Encouraged to let me know if needs arise.
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
## 2015-05-08 NOTE — Progress Notes (Signed)
Did not attend group 

## 2015-05-08 NOTE — ED Notes (Signed)
Patient noted sleeping in room. No complaints, stable, in no acute distress. Q15 minute rounds and monitoring via Security Cameras to continue.  

## 2015-05-08 NOTE — BH Assessment (Signed)
[facility] Assessment Progress Note  Per Thedore MinsMojeed Akintayo, MD, this pt requires psychiatric hospitalization at this time.  Lillia AbedLindsay, RN, [facility] Rm [facility], requesting that he be transferred at 14:00.  Pt has signed Voluntary Admission and Consent for Treatment, as well as Consent to Release Information to no one, and signed [facility].  Pt's nurse, Kendal Hymendie, has been notified, and agrees to send original paperwork along with pt via [facility], and to call report to [phone].  Doylene Canninghomas Bellarae Lizer, MA Triage Specialist [phone]

## 2015-05-08 NOTE — Progress Notes (Signed)
D   Pt has spent the shift in bed   He was encouraged to come for his medications but has not done so yet   Pt is depressed and sad   He does not complain of any withdrawal symptoms A   Verbal support given   Medications offered  Q 15 min checks R   Pt safe at present

## 2015-05-08 NOTE — ED Notes (Signed)
Pt presents w/ [employer] d/t SI w/ plan to shoot himself.  Pt reports having access to a gun.  Per pt he believes he needs inpatient tx, stating, "I can't balance myself."  Pt also stated that, "this is the last time I ask for help."  Admits to ETOH use as well as cocaine PTA.  +AH, pt states he is hearing his grandmothers voice in his head.  Does not elaborate on what she tells him.  Denies HI.

## 2015-05-08 NOTE — ED Notes (Signed)
TTS in progress 
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
## 2015-05-08 NOTE — BH Assessment (Signed)
Per Drenda FreezeFran, NP - patient meets criteria for inpatient hospitalization.
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
## 2015-05-08 NOTE — Tx Team (Addendum)
Initial Interdisciplinary Treatment Plan   PATIENT STRESSORS: Loss of Father Medication change or noncompliance Substance abuse   PATIENT STRENGTHS: Communication skills Physical Health   PROBLEM LIST: Problem List/Patient Goals Date to be addressed Date deferred Reason deferred Estimated date of resolution  Substance abuse 05/08/15     Psychosis 05/08/15     Depression 05/08/15     "Get on my meds" 05/08/15     "Go to long term treatment" 05/08/15     Suicidal ideation 05/08/15                        DISCHARGE CRITERIA:  Improved stabilization in mood, thinking, and/or behavior Verbal commitment to aftercare and medication compliance  PRELIMINARY DISCHARGE PLAN: Outpatient therapy Medication management  PATIENT/FAMIILY INVOLVEMENT: This treatment plan has been presented to and reviewed with the patient, Zachary Hull.  The patient and family have been given the opportunity to ask questions and make suggestions.  Norm ParcelHeather V Boone Gear 05/08/2015, 5:07 PM

## 2015-05-09 ENCOUNTER — Encounter (HOSPITAL_COMMUNITY): Payer: Self-pay | Admitting: Psychiatry

## 2015-05-09 DIAGNOSIS — R45851 Suicidal ideations: Secondary | ICD-10-CM

## 2015-05-09 DIAGNOSIS — F1220 Cannabis dependence, uncomplicated: Secondary | ICD-10-CM

## 2015-05-09 DIAGNOSIS — F203 Undifferentiated schizophrenia: Principal | ICD-10-CM

## 2015-05-09 DIAGNOSIS — F1020 Alcohol dependence, uncomplicated: Secondary | ICD-10-CM

## 2015-05-09 DIAGNOSIS — F1420 Cocaine dependence, uncomplicated: Secondary | ICD-10-CM

## 2015-05-09 NOTE — Progress Notes (Signed)
Adult Psychoeducational Group Note  Date:  05/09/2015 Time:  9:11 PM  Group Topic/Focus:  Wrap-Up Group:   The focus of this group is to help patients review their daily goal of treatment and discuss progress on daily workbooks.  Participation Level:  Did Not Attend  Participation Quality:  Did not attend  Affect:  Did not attend  Cognitive:  Did not attend  Insight: None  Engagement in Group:  Did not attend  Modes of Intervention:  Did not attend  Additional Comments:  Patient did not attend wrap up group tonight.   Zachary Hull L Zachary Hull 05/09/2015, 9:11 PM

## 2015-05-09 NOTE — [facility] Suicide Risk Assessment (Signed)
[facility]   Nursing information obtained from:  Patient Demographic factors:  Male, Unemployed Current Mental Status:  Suicidal ideation indicated by patient Loss Factors:  Loss of significant relationship Historical Factors:  Prior suicide attempts Risk Reduction Factors:  Living with another person, especially a relative  Total Time spent with patient: 30 minutes Principal Problem: Schizophrenia, undifferentiated (HCC) Diagnosis:   Patient Active Problem List   Diagnosis Date Noted  . Cocaine use disorder, severe, dependence (HCC) [F14.20] 01/26/2015  . Schizophrenia, undifferentiated (H[facility]) [F20.3] 01/25/2015  . Cannabis use disorder, moderate, dependence (HCC) [F12.20] 12/19/2014  . Alcohol use disorder, severe, dependence (HCC) [F10.20] 12/16/2014   Subjective Data: Please see H&P.   Continued Clinical Symptoms:  Alcohol Use Disorder Identification Test Final Score (AUDIT): 30 The "Alcohol Use Disorders Identification Test", Guidelines for Use in Primary Care, Second Edition.  World Science [employer]Health [employer](WHO). Score between 0-7:  no or low risk or alcohol related problems. Score between 8-15:  moderate risk of alcohol related problems. Score between 16-19:  high risk of alcohol related problems. Score 20 or above:  warrants further diagnostic evaluation for alcohol dependence and treatment.   CLINICAL FACTORS:   Alcohol/Substance Abuse/Dependencies Unstable or Poor Therapeutic Relationship Previous Psychiatric Diagnoses and Treatments   Musculoskeletal: Strength & Muscle Tone: within normal limits Gait & Station: normal Patient leans: N/A  Psychiatric Specialty Exam: Review of Systems  Psychiatric/Behavioral: Positive for depression, suicidal ideas, hallucinations and substance abuse. The patient is nervous/anxious and has insomnia.   All other systems reviewed and are negative.   Blood pressure 99/62, pulse 79, temperature 98 F (36.7  C), temperature source Oral, resp. rate 16, height 5\' 3"  (1.6 m), weight 74.844 kg (165 lb).Body mass index is 29.24 kg/(m^2).                      Please see H&P.                                   COGNITIVE FEATURES THAT CONTRIBUTE TO RISK:  Closed-mindedness, Polarized thinking and Thought constriction (tunnel vision)    SUICIDE RISK:   Moderate:  Frequent suicidal ideation with limited intensity, and duration, some specificity in terms of plans, no associated intent, good self-control, limited dysphoria/symptomatology, some risk factors present, and identifiable protective factors, including available and accessible social support.  PLAN OF CARE: Please see H&P.   I certify that inpatient services furnished can reasonably be expected to improve the patient's condition.   Noreen Mackintosh, MD 05/09/2015, 1:46 PM

## 2015-05-09 NOTE — Tx Team (Signed)
Interdisciplinary Treatment Plan Update (Adult)  Date:  05/09/2015  Time Reviewed:  8:47 AM   Progress in Treatment: Attending groups: No. New to unit. Continuing to assess.  Participating in groups:  No. Taking medication as prescribed:  Yes. Tolerating medication:  Yes. Family/Significant othe contact made:  SPE required for this pt. Pt reports access to gun but will not state where gun is located.  Patient understands diagnosis:  Yes. and As evidenced by:  seeking treatment for SI, depression, AVH, alcohol abuse, and for medication stabilization. Discussing patient identified problems/goals with staff:  Yes. Medical problems stabilized or resolved:  Yes. Denies suicidal/homicidal ideation: No.Pt reports passive SI/able to contract for safety on the unit.  Issues/concerns per patient self-inventory:  Other:  Discharge Plan or Barriers: CSW assessing for appropriate referrals. Pt reports that he receives services from [employer].   Reason for Continuation of Hospitalization: Depression Medication stabilization Suicidal ideation Withdrawal symptoms  AVH  Comments:  Patient is a 55 year old male that reports SI with a plan to shoot himself with a gun. Patient reports that he has access to a gun but he would not state where the gun is located or what type of gun he has. Patient reports that he is not able to contract for safety. Patient reports that the voices are telling him to harm himself. Patient reports that he does not believe that his medication is working, so he stopped taking the medication. Patient reports that he receives services from the [employer]. Patient reports a diagnosis of Schizophrenia. Patient reports that he has hearing his grandmother's voice and seeing dead people. Patient reports that his grandmother died several years ago. Patient reports that he has been abusing alcohol and he needs assistance with quitting.Patient was not able to remember how much he  has been drinking or how long he has been drinking. Patient denies seizures. Patient denies withdrawal symptoms. Patient reports that his last in rehab six months ago at [facility] IN MARCH 2017. Patient reports multiple psychiatric hospitalizations. Patient was recently discharged from [facility] in March 2017.  Estimated length of stay:  3-5 days   New goal(s): to develop effective aftercare plan.   Additional Comments:  Patient and CSW reviewed pt's identified goals and treatment plan. Patient verbalized understanding and agreed to treatment plan. CSW reviewed [facility] "Discharge Process and Patient Involvement" Form. Pt verbalized understanding of information provided and signed form.    Review of initial/current patient goals per problem list:  1. Goal(s): Patient will participate in aftercare plan  Met: No.   Target date: at discharge  As evidenced by: Patient will participate within aftercare plan AEB aftercare provider and housing plan at discharge being identified.  4/18: CSW assessing for appropriate referrals. Pt reports that he is current with [facility].   2. Goal (s): Patient will exhibit decreased depressive symptoms and suicidal ideations.  Met: No.    Target date: at discharge  As evidenced by: Patient will utilize self rating of depression at 3 or below and demonstrate decreased signs of depression or be deemed stable for discharge by MD.  4/18: Pt continues to endorse high depression and passive SI. No HI reported and pt is able to contract for safety on the unit.   3. Goal(s): Patient will demonstrate decreased signs and symptoms of psychosis.   Met:No.   Target date: at discharge  As evidenced by: Patient will report no AVH and  will present with no signs of psychosis/paranoia.    4/18: Pt continues to endorse AVH at times.   4. Goal(s): Patient will demonstrate decreased signs of  withdrawal due to substance abuse  Met:No.   Target date:at discharge   As evidenced by: Patient will produce a CIWA/COWS score of 0, have stable vitals signs, and no symptoms of withdrawal.  4/18: Pt reports mild withdrawals with current CIWA score of 4 and stable vitals.   Attendees: Patient:   05/09/2015 8:47 AM   Family:   05/09/2015 8:47 AM   Physician:  Dr. Ursula Alert MD  05/09/2015 8:47 AM   Nursing:   Patrecia Pace; Phillis Haggis. RN 05/09/2015 8:47 AM   Clinical Social Worker: Maxie Better, LCSW 05/09/2015 8:47 AM   Clinical Social Worker: Roque Lias LCSW  05/09/2015 8:47 AM   Other:  Gerline Legacy Nurse Case Manager 05/09/2015 8:47 AM   Other:   05/09/2015 8:47 AM   Other:   05/09/2015 8:47 AM   Other:  05/09/2015 8:47 AM   Other:  05/09/2015 8:47 AM   Other:  05/09/2015 8:47 AM    05/09/2015 8:47 AM    05/09/2015 8:47 AM    05/09/2015 8:47 AM    05/09/2015 8:47 AM    Scribe for Treatment Team:   Maxie Better, LCSW 05/09/2015 8:47 AM

## 2015-05-09 NOTE — Progress Notes (Signed)
Patient ID: Zachary Hull, male   DOB: February 07, 1960, 55 y.o.   MRN: [id number] Held 1700 Librium because he has slept all shift except for getting up for meals. He took his two other Librium as scheduled but at noon told writer I was giving him too much medicine too often. BP low and sleeping excessively so held pm Librium.

## 2015-05-09 NOTE — [facility] Group Notes (Signed)
[facility] LCSW Group Therapy  05/09/2015 , 1:38 PM   Type of Therapy:  Group Therapy  Participation Level:  Active  Participation Quality:  Attentive  Affect:  Appropriate  Cognitive:  Alert  Insight:  Improving  Engagement in Therapy:  Engaged  Modes of Intervention:  Discussion, Exploration and Socialization  Summary of Progress/Problems: Today's group focused on the term Diagnosis.  Participants were asked to define the term, and then pronounce whether it is a negative, positive or neutral term.  Invited.  Chose to not attend.  "I just got a pill."  [name], Toniqua Melamed B 05/09/2015 , 1:38 PM

## 2015-05-09 NOTE — Progress Notes (Signed)
Patient ID: Zachary Hull, male   DOB: 1960-02-17, 55 y.o.   MRN: [id number] D-Sleeping most of the am. States all his medications are making him tired. Woke him up to give him Librium. Irritable, but not unpleasant. No behavior issues.  A-Support offered. Monitored for safety. Medications as ordered. R-No complaints voiced. Did not attend am group. Isolative to self and room in his bed.

## 2015-05-10 MED ORDER — MENTHOL 3 MG MT LOZG
1.0000 | LOZENGE | Freq: Four times a day (QID) | OROMUCOSAL | Status: DC
  Administered 2015-05-10 – 2015-05-11 (×4): 3 mg via ORAL

## 2015-05-10 MED ORDER — TRAZODONE HCL 50 MG PO TABS
50.0000 mg | ORAL_TABLET | Freq: Every day | ORAL | Status: DC
  Administered 2015-05-10 – 2015-05-12 (×3): 50 mg via ORAL
  Filled 2015-05-10 (×6): qty 1

## 2015-05-10 MED ORDER — PHENOL 1.4 % MT LIQD
1.0000 | Freq: Three times a day (TID) | OROMUCOSAL | Status: DC
  Administered 2015-05-10 – 2015-05-11 (×5): 1 via OROMUCOSAL
  Filled 2015-05-10: qty 177
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
## 2015-05-10 NOTE — Progress Notes (Signed)
Adult Psychoeducational Group Note  Date:  05/10/2015 Time:  9:10 PM  Group Topic/Focus:  Wrap-Up Group:   The focus of this group is to help patients review their daily goal of treatment and discuss progress on daily workbooks.  Participation Level:  Did Not Attend  Participation Quality:  Did not attend  Affect:  Did not attend  Cognitive:  Did not attend   Insight: None  Engagement in Group:  Did not attend  Modes of Intervention:  Did not attend  Additional Comments:  Patient did not attend wrap up group tonight.   Allisson Schindel L Kellyann Ordway 05/10/2015, 9:10 PM

## 2015-05-10 NOTE — BHH Counselor (Signed)
Adult Comprehensive Assessment  Patient ID: Zachary Hull, male   DOB: 01/25/1960, 55 y.o.   MRN: [id number]  Information Source: Information source: Patient  Current Stressors:  Employment / Job issues: on disability Family Relationships: no family/friend support Surveyor, quantity / Lack of resources (include bankruptcy): on fixed income Housing: currently homeless Bereavement / Loss: both mother and father passed in past 6 years Substance Abuse: Patient UDS positive for Cocaine and alcohol.  Bereavement: Father died in 2015-01-29 Living/Environment/Situation:  Living Arrangements: homeless  Living conditions (as described by patient or guardian): Stressful; unsafe; chaotic.  How long has patient lived in current situation?: few months on and off. Staying with family at times.  What is atmosphere in current home: Chaotic; Temporary  Family History:  Marital status: Divorced Divorced, when?: last year What types of issues is patient dealing with in the relationship?: pt reports he was drinking and his wife was tired of it Additional relationship information: N/A Are you sexually active?: No What is your sexual orientation?: heterosexual Has your sexual activity been affected by drugs, alcohol, medication, or emotional stress?: no Does patient have children?: No  Childhood History:  By whom was/is the patient raised?: Grandparents Additional childhood history information: Pt reports having a great childhood. Pt states that he was raised by his grandparents because his parents worked.  Description of patient's relationship with caregiver when they were a child: Pt reports getting along great with grandparents growing up.  Patient's description of current relationship with people who raised him/her: Grandparents and parents are deceased.  How were you disciplined when you got in trouble as a child/adolescent?: talked to Does patient have siblings?: No Did patient suffer any  verbal/emotional/physical/sexual abuse as a child?: No Did patient suffer from severe childhood neglect?: No Has patient ever been sexually abused/assaulted/raped as an adolescent or adult?: No Was the patient ever a victim of a crime or a disaster?: No Witnessed domestic violence?: No Has patient been effected by domestic violence as an adult?: No  Education:  Highest grade of school patient has completed: graduated high school Currently a Consulting civil engineer?: No Learning disability?: No  Employment/Work Situation:  Employment situation: On disability Why is patient on disability: mental health How long has patient been on disability: since 2009 Patient's job has been impacted by current illness: No What is the longest time patient has a held a job?: 2 years Where was the patient employed at that time?: CNA Has patient ever been in the [employer]?: No Has patient ever served in combat?: No Did You Receive Any Psychiatric Treatment/Services While in Equities trader?: No Are There Guns or Other Weapons in Your Home?: No Are These Comptroller?: Yes  Financial Resources:  Surveyor, quantity resources: [employer], Medicaid Does patient have a Lawyer or guardian?: No  Alcohol/Substance Abuse:  What has been your use of drugs/alcohol within the last 12 months?: has been drinking half a gallon of liquor daily for the last month If attempted suicide, did drugs/alcohol play a role in this?: Yes Alcohol/Substance Abuse Treatment Hx: Past Tx, Inpatient, Attends AA/NA, Past detox, Past Tx, Outpatient If yes, describe treatment: Reports being inpatient numerous times for his drug/alcohol use - unable to name the centers. Pt was inpatient at [facility] 3/17, 01/25/15, 12/17/14, and 03/09/12 for similar issues.  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 Substance abuse.   Discharge Plan:  Does patient have access to transportation?: Yes Will patient be returning to same living situation after discharge?: No-pt planning to [facility].  If no, would patient like referral for services when discharged?: Pt will need to follow-up with outpatient provider in [place] if he decides to go [place]. Pt also interested in staying in the area and following up with ACT services--CSW assessing.  Does patient have financial barriers related to discharge medications?: No  Summary/Recommendations:   Summary and Recommendations (to be completed by the evaluator): Patient is 55 year old male [place] in [place], [place] He has a diagnosis of schizophrenia, alcohol use disorder, and cocaine use disorder. Patient presents to the hospital seeking treatment for suicidal ideations with a plan to shoot himself, alcohol abuse, cocaine abuse, increased mood instability, and for medication stabilization. patient reports that he owns a gun but would not state where this was during initial assessment. Patient reports AH at times. He would benefit from: Crisis stabilization, therapeutic milieu, encourage group attendance and participation, medication management for withdrawals/mood stabilization, and development of comprehensive mental wellness/sobriety plan. CSW assessing for appropriate referrals.   Smart, Brogen Duell LCSW 05/10/2015

## 2015-05-10 NOTE — [facility] Group Notes (Signed)
[facility] LCSW Group Therapy  05/10/2015 1:38 PM  Type of Therapy: Group Therapy  Participation Level: Inactive  Participation Quality: Attentive  Affect: Flat  Cognitive: Oriented  Insight: Limited  Engagement in Therapy: Engaged  Modes of Intervention: Discussion and Socialization  Summary of Progress/Problems: Zachary Hull from [facility] was here to tell his story of recovery and play his guitar.  Was present for the beginning of group but was on the phone. When asked to get off the phone because group was starting, pt muttered something under his breathe angrily and left. Did not return.   Zachary Hull 05/10/2015 1:38 PM

## 2015-05-10 NOTE — [facility] Group Notes (Signed)
[facility]   05/10/2015 10:42 AM  Participation Quality: Invited. Chose not to attend.    Zachary [name]

## 2015-05-10 NOTE — Progress Notes (Signed)
D) Pt has been isolative to room despite prompting. seclusive to self. Pt has been negative for groups and unit activities when prompted. Amontae c/o sore throat throughout this shift. Pt has been positive for AVH and intermittent passive s.i. A) level 3 obs for safety, support and encouragement provided. Prompting as needed. Med ed reinforced. Rapid strep collected for lab. R) Seclusive and isolative.

## 2015-05-11 DIAGNOSIS — B950 Streptococcus, group A, as the cause of diseases classified elsewhere: Secondary | ICD-10-CM | POA: Clinically undetermined

## 2015-05-11 LAB — PROLACTIN: Prolactin: 37.4 ng/mL — ABNORMAL HIGH (ref 4.0–15.2)

## 2015-05-11 LAB — RAPID STREP SCREEN (MED CTR MEBANE ONLY): STREPTOCOCCUS, GROUP A SCREEN (DIRECT): POSITIVE — AB

## 2015-05-11 MED ORDER — MENTHOL 3 MG MT LOZG
1.0000 | LOZENGE | OROMUCOSAL | Status: DC | PRN
  Administered 2015-05-11: 3 mg via ORAL

## 2015-05-11 MED ORDER — PHENOL 1.4 % MT LIQD
1.0000 | OROMUCOSAL | Status: DC | PRN
  Administered 2015-05-11 – 2015-05-12 (×3): 1 via OROMUCOSAL
  Filled 2015-05-11: qty 177

## 2015-05-11 MED ORDER — CEPHALEXIN 500 MG PO CAPS
500.0000 mg | ORAL_CAPSULE | Freq: Three times a day (TID) | ORAL | Status: DC
  Administered 2015-05-11 – 2015-05-13 (×6): 500 mg via ORAL
  Filled 2015-05-11 (×12): qty 1
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
## 2015-05-11 NOTE — [facility] Group Notes (Signed)
[facility] Group Notes:  (Counselor/Nursing/MHT/Case Management/Adjunct)  05/11/2015 1:15PM  Type of Therapy:  Group Therapy  Participation Level:  Active  Participation Quality:  Appropriate  Affect:  Flat  Cognitive:  Oriented  Insight:  Improving  Engagement in Group:  Limited  Engagement in Therapy:  Limited  Modes of Intervention:  Discussion, Exploration and Socialization  Summary of Progress/Problems: The topic for group was balance in life.  Pt participated in the discussion about when their life was in balance and out of balance and how this feels.  Pt discussed ways to get back in balance and short term goals they can work on to get where they want to be. C/O about medication and how it makes him drowsey.  In fact, was sleeping in group.  Was able to wake up and share.  Stated that staying active is something that helps him stay balanced.  Also admitted that right now he needs more strurture in his life to stay balanced.  Left to his won devices, he messes up by not taking his meds and relapsing.   [name], Aja Bolander B 05/11/2015 1:21 PM

## 2015-05-11 NOTE — Progress Notes (Signed)
D   Pt has isolated to his room this evening   He came to get his bedtime medications and said he really feels bad and is congested    He denies withdrawal symptoms   Reports his throat is very sore and he has a cough A   Verbal support given   Medications administered and effectiveness monitored    Q 15 min checks    R   Pt safe at present and reports some measure of relief from cold symptoms and he is sleeping

## 2015-05-11 NOTE — Progress Notes (Signed)
Adult Psychoeducational Group Note  Date:  05/11/2015 Time:  9:34 PM   Group Topic/Focus:  Wrap-Up Group:   The focus of this group is to help patients review their daily goal of treatment and discuss progress on daily workbooks.  Participation Level:  Active  Participation Quality:  Appropriate  Affect:  Appropriate  Cognitive:  Alert  Insight: Appropriate  Engagement in Group:  Engaged  Modes of Intervention:  Discussion  Additional Comments:  Patient goal for today was to talk with someone about getting into another treatment following discharge. On a scale between 1-10, (1=worse, 10=best) patient rated his day a 6.  Gaye Scorza L Santrice Muzio 05/11/2015, 9:34 PM

## 2015-05-11 NOTE — Progress Notes (Addendum)
D-  Patient has been isolative to his room for the majority of the shift.  Patient had no incidents of behavioral dyscontrol.  Patient was compliant with meals and medications.  Patient denies AVH Patient also denies SI, and HI.   A-  Assess patient for safety, offer medications as prescribed, engaged patient in 1:1 staff talks.  R-  Patient was able to contract for safety.   Continue to monitor.

## 2015-05-12 MED ORDER — GUAIFENESIN 100 MG/5ML PO SOLN
5.0000 mL | ORAL | Status: DC | PRN
  Administered 2015-05-12 – 2015-05-13 (×3): 100 mg via ORAL
  Filled 2015-05-12 (×3): qty 5

## 2015-05-12 MED ORDER — FLUPHENAZINE HCL 5 MG PO TABS
17.5000 mg | ORAL_TABLET | Freq: Every day | ORAL | Status: DC
  Administered 2015-05-12: 17.5 mg via ORAL
  Filled 2015-05-12 (×3): qty 1
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
## 2015-05-12 NOTE — Progress Notes (Signed)
Did not attend group 

## 2015-05-12 NOTE — Progress Notes (Signed)
D: Patient isolated in his room. Came out for bedtime medication and went back to bed. Minimal conversation. Denies pain, SI, AH/VH at this time. Made no complaints. Requested for cough syrup.  A: Staff offered support and encouragement as needed. Due/PRN medications given as ordered. Every 15 minutes check for safety maintained. Will continue to monitor patient for safety and stability.  R: Patient remains safe.

## 2015-05-12 NOTE — Progress Notes (Signed)
  [facility] :  Will you be returning to the same living situation after discharge:  No. Says he will stay with Aunt At discharge, do you have transportation home?: Yes,  bus pass Do you have the ability to pay for your medications: Yes,  MCD  Release of information consent forms completed and in the chart;  Patient's signature needed at discharge.  Patient to Follow up at: Follow-up Information    [facility]   Why:  They will pick you up Monday AM for group after you have called them to check in at 8:30.  they also have a psychiatrist who will see you for your medication adjustments   Contact information:   [address]  [place] [phone]      Next level of care provider has access to [facility] Link:no  Safety Planning and Suicide Prevention discussed: Yes,  yes  Have you used any form of tobacco in the last 30 days? (Cigarettes, Smokeless Tobacco, Cigars, and/or Pipes): Yes  Has patient been referred to the Quitline?: Patient refused referral  Patient has been referred for addiction treatment: Yes  Ida Rogueorth, Karthik Whittinghill B 05/12/2015, 3:42 PM

## 2015-05-12 NOTE — [facility] Group Notes (Signed)
[facility] LCSW Group Therapy  05/12/2015  1:05 PM  Type of Therapy:  Group therapy  Participation Level:  Active  Participation Quality:  Attentive  Affect:  Flat  Cognitive:  Oriented  Insight:  Limited  Engagement in Therapy:  Limited  Modes of Intervention:  Discussion, Socialization  Summary of Progress/Problems:  Chaplain was here to lead a group on themes of hope and courage. [name] was called out during group.  He returned close to the end.  Only contribution was to say he needs courage for going forward into a rehab program.  [name], [name] 05/12/2015 1:24 PM

## 2015-05-12 NOTE — [facility] Suicide Risk Assessment (Signed)
[facility] INPATIENT:  Family/Significant Other Suicide Prevention Education  Suicide Prevention Education:  Patient Refusal for Family/Significant Other Suicide Prevention Education: The patient Zachary Hull has refused to provide written consent for family/significant other to be provided Family/Significant Other Suicide Prevention Education during admission and/or prior to discharge.  Physician notified.  Zachary Hull, Zachary Hull 05/12/2015, 4:19 PM

## 2015-05-12 NOTE — Tx Team (Addendum)
Interdisciplinary Treatment Plan Update (Adult)  Date:  05/12/2015  Time Reviewed:  11:33 AM   Progress in Treatment: Attending groups: Intermittently Participating in groups:  When present Taking medication as prescribed:  Yes. Tolerating medication:  Yes. Family/Significant othe contact made:  No Patient understands diagnosis:  Yes. and As evidenced by:  seeking treatment for SI, depression, AVH, alcohol abuse, and for medication stabilization. Discussing patient identified problems/goals with staff:  Yes. Medical problems stabilized or resolved:  Yes. Denies suicidal/homicidal ideation: No.Pt reports passive SI/able to contract for safety on the unit.  Issues/concerns per patient self-inventory:  Other:  Discharge Plan or Barriers: CSW assessing for appropriate referrals. Pt reports that he receives services from [employer].   Reason for Continuation of Hospitalization:   Comments:  Patient is a 55 year old male that reports SI with a plan to shoot himself with a gun. Patient reports that he has access to a gun but he would not state where the gun is located or what type of gun he has. Patient reports that he is not able to contract for safety. Patient reports that the voices are telling him to harm himself. Patient reports that he does not believe that his medication is working, so he stopped taking the medication. Patient reports that he receives services from the [employer]. Patient reports a diagnosis of Schizophrenia. Patient reports that he has hearing his grandmother's voice and seeing dead people. Patient reports that his grandmother died several years ago. Patient reports that he has been abusing alcohol and he needs assistance with quitting.Patient was not able to remember how much he has been drinking or how long he has been drinking. Patient denies seizures. Patient denies withdrawal symptoms. Patient reports that his last in rehab six months ago at [facility] IN MARCH 2017. Patient reports multiple psychiatric hospitalizations. Patient was recently discharged from [facility] in March 2017.  05/12/15: Will increase Prolixin to 17.5 mg po qhs for psychosis. Will continue Cogentin 0.5 mg po qhs for EPS. Will continue Remeron 15 mg po qhs for sleep.  Estimated length of stay:  Likley d/c tomorrow   New goal(s): to develop effective aftercare plan.   Additional Comments:  Patient and CSW reviewed pt's identified goals and treatment plan. Patient verbalized understanding and agreed to treatment plan. CSW reviewed [facility] "Discharge Process and Patient Involvement" Form. Pt verbalized understanding of information provided and signed form.    Review of initial/current patient goals per problem list:  1. Goal(s): Patient will participate in aftercare plan  Met: Yes  Target date: at discharge  As evidenced by: Patient will participate within aftercare plan AEB aftercare provider and housing plan at discharge being identified.  4/18: CSW assessing for appropriate referrals. Pt reports that he is current with [facility].  05/12/15:  Plans to go to [facility] program  2. Goal (s): Patient will exhibit decreased depressive symptoms and suicidal ideations.  Met: Yes    Target date: at discharge  As evidenced by: Patient will utilize self rating of depression at 3 or below and demonstrate decreased signs of depression or be deemed stable for discharge by MD.  4/18: Pt continues to endorse high depression and passive SI. No HI reported and pt is able to contract for safety on the unit.  05/15/15:  Rates his depression a 3 today  3. Goal(s): Patient will demonstrate decreased signs and symptoms of psychosis.  Met:Yes   Target date: at discharge  As evidenced by: Patient will report no AVH and will present with no signs of psychosis/paranoia.    4/18: Pt continues to  endorse AVH at times.   05/12/15: Denies AH, VH today  4. Goal(s): Patient will demonstrate decreased signs of withdrawal due to substance abuse  Met:Yes   Target date:at discharge   As evidenced by: Patient will produce a CIWA/COWS score of 0, have stable vitals signs, and no symptoms of withdrawal.  4/18: Pt reports mild withdrawals with current CIWA score of 4 and stable vitals.  05/12/15:  No signs nor symptoms of withdrawal today  Attendees: Patient:   05/12/2015 11:33 AM   Family:   05/12/2015 11:33 AM   Physician:  Dr. Ursula Alert MD  05/12/2015 11:33 AM   Nursing:    Phillis Haggis. RN 05/12/2015 11:33 AM   Clinical Social Worker: Maxie Better, LCSW 05/12/2015 11:33 AM   Clinical Social Worker: Roque Lias LCSW  05/12/2015 11:33 AM   Other:  Gerline Legacy Nurse Case Manager 05/12/2015 11:33 AM   Other:   05/12/2015 11:33 AM   Other:   05/12/2015 11:33 AM   Other:  05/12/2015 11:33 AM   Other:  05/12/2015 11:33 AM   Other:  05/12/2015 11:33 AM    05/12/2015 11:33 AM    05/12/2015 11:33 AM    05/12/2015 11:33 AM    05/12/2015 11:33 AM    Scribe for Treatment Team:   Ripley Fraise  05/12/2015 11:33 AM

## 2015-05-12 NOTE — Progress Notes (Signed)
DAR NOTE: Patient presents with anxious affect and depressed mood.  Denies pain, auditory and visual hallucinations.  Rates depression at 7, hopelessness at 5, and anxiety at 5.  No withdrawal symptoms noted.  Maintained on routine safety checks.  Medications given as prescribed.  Support and encouragement offered as needed.  Attended group and participated.  States goal for today is "getting into a treatment program."  Patient remained in his room most of this shift sleeping.  Antibiotic therapy continues for strept throat.  No adverse reaction noted.

## 2015-05-12 NOTE — Progress Notes (Signed)
D: Pt presents with a depressed/anxious mood. Pt reports/presents with body aches and a cough. Pt medicated with tylenol, Keflex, Cepacol, and chloraseptic in regards to the his strept-throat. Pt encouraged to drink fluids to help with his secretions. Pt receptive to the information. Pt present within the milieu but with minimal interactions with others. Pt denies any SI/HI/AVH.  A: Writer administered scheduled and prn medications to pt, per MD orders. Continued support and availability as needed was extended to this pt. Staff continues to monitor pt with q4115min checks.  R: No adverse drug reactions noted. Pt receptive to treatment. Pt remains safe at this time.

## 2015-05-12 NOTE — [facility] Group Notes (Signed)
[facility] Note   05/12/2015 9:46 AM  Participation Quality:  Invited.  Chose to not attend    Kiribatiorth, Thereasa Distanceodney B

## 2015-05-13 MED ORDER — BENZTROPINE MESYLATE 0.5 MG PO TABS: 0.5000 mg | ORAL_TABLET | Freq: Every day | ORAL | Status: AC

## 2015-05-13 MED ORDER — NICOTINE 21 MG/24HR TD PT24: 21.0000 mg | MEDICATED_PATCH | Freq: Every day | TRANSDERMAL | Status: AC

## 2015-05-13 MED ORDER — TRAZODONE HCL 50 MG PO TABS: 50.0000 mg | ORAL_TABLET | Freq: Every day | ORAL | Status: AC

## 2015-05-13 MED ORDER — CEPHALEXIN 500 MG PO CAPS: 500.0000 mg | ORAL_CAPSULE | Freq: Three times a day (TID) | ORAL | Status: AC

## 2015-05-13 MED ORDER — FLUPHENAZINE HCL 2.5 MG PO TABS: 17.5000 mg | ORAL_TABLET | Freq: Every day | ORAL | Status: AC

## 2015-05-13 NOTE — Progress Notes (Signed)
Pt discharged home with prescriptions. Pt was stable and appreciative at that time. All papers and prescriptions were given and valuables returned. Verbal understanding expressed. Denies SI/HI and A/VH. Pt given opportunity to express concerns and ask questions.
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
## 2015-05-13 NOTE — [facility] Suicide Risk Assessment (Signed)
[facility]   Principal Problem: Schizophrenia, undifferentiated (HCC) Discharge Diagnoses:  Patient Active Problem List   Diagnosis Date Noted  . Group A streptococcal infection [B95.0] 05/11/2015  . Cocaine use disorder, severe, dependence (HCC) [F14.20] 01/26/2015  . Schizophrenia, undifferentiated (HCC) [F20.3] 01/25/2015  . Cannabis use disorder, moderate, dependence (HCC) [F12.20] 12/19/2014  . Alcohol use disorder, severe, dependence (HCC) [F10.20] 12/16/2014    Total Time spent with patient: 30 minutes  Musculoskeletal: Strength & Muscle Tone: within normal limits Gait & Station: normal Patient leans: N/A  Psychiatric Specialty Exam: ROS  Blood pressure 119/87, pulse 88, temperature 97.7 F (36.5 C), temperature source Oral, resp. rate 18, height 5\' 3"  (1.6 m), weight 74.844 kg (165 lb).Body mass index is 29.24 kg/(m^2).  General Appearance: Casual  Eye Contact::  Fair  Speech:  Normal Rate409  Volume:  Decreased  Mood:  Euthymic  Affect:  Appropriate  Thought Process:  Coherent  Orientation:  Full (Time, Place, and Person)  Thought Content:  WDL  Suicidal Thoughts:  No  Homicidal Thoughts:  No  Memory:  Immediate;   Good Recent;   Good Remote;   Good  Judgement:  Fair  Insight:  Fair  Psychomotor Activity:  Normal  Concentration:  Fair  Recall:  Good  Fund of Knowledge:Fair  Language: Fair  Akathisia:  No  Handed:  Right  AIMS (if indicated):     Assets:  Communication Skills Desire for Improvement Housing Social Support  Sleep:  Number of Hours: 7  Cognition: WNL  ADL's:  Intact   Mental Status Per Nursing Assessment::   On Admission:  Suicidal ideation indicated by patient  Demographic Factors:  Male  Loss Factors: NA  Historical Factors: Impulsivity  Risk Reduction Factors:   Sense of responsibility to family, Religious beliefs about death, Living with another person, especially a relative, Positive social  support, Positive therapeutic relationship and Positive coping skills or problem solving skills  Continued Clinical Symptoms:  Unstable or Poor Therapeutic Relationship Previous Psychiatric Diagnoses and Treatments  Cognitive Features That Contribute To Risk:  None    Suicide Risk:  Minimal: No identifiable suicidal ideation.  Patients presenting with no risk factors but with morbid ruminations; may be classified as minimal risk based on the severity of the depressive symptoms  Follow-up Information    [facility]   Why:  They will pick you up Monday AM for group after you have called them to check in at 8:30.  they also have a psychiatrist who will see you for your medication adjustments   Contact information:   [address] [facility]   [phone]      Plan Of Care/Follow-up recommendations:  Activity:  As tolerated Diet:  Unchanged from the past  Rilynn Habel T., MD 05/13/2015, 10:57 AM

## 2015-05-13 NOTE — [facility] Group Notes (Signed)
[facility] Group Notes: (Clinical Social Work)   05/13/2015      Type of Therapy:  Group Therapy   Participation Level:  Did Not Attend despite MHT prompting   Stefany Starace Grossman-Orr, LCSW 05/13/2015, 1:11 PM     
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
## 2020-09-21 DEATH — deceased
